# Patient Record
Sex: Male | Born: 1965
Health system: Southern US, Community
[De-identification: ages and names within clinical notes are randomized; demographics above are authoritative.]

## PROBLEM LIST (undated history)

## (undated) DIAGNOSIS — C801 Malignant (primary) neoplasm, unspecified: Secondary | ICD-10-CM

## (undated) DIAGNOSIS — T7840XA Allergy, unspecified, initial encounter: Secondary | ICD-10-CM

## (undated) DIAGNOSIS — I10 Essential (primary) hypertension: Secondary | ICD-10-CM

## (undated) DIAGNOSIS — K219 Gastro-esophageal reflux disease without esophagitis: Secondary | ICD-10-CM

## (undated) DIAGNOSIS — F1010 Alcohol abuse, uncomplicated: Secondary | ICD-10-CM

## (undated) HISTORY — DX: Essential (primary) hypertension: I10

## (undated) HISTORY — DX: Gastro-esophageal reflux disease without esophagitis: K21.9

## (undated) HISTORY — DX: Alcohol abuse, uncomplicated: F10.10

## (undated) HISTORY — PX: NO PAST SURGERIES: SHX2092

## (undated) HISTORY — DX: Allergy, unspecified, initial encounter: T78.40XA

---

## 1998-12-10 ENCOUNTER — Emergency Department (HOSPITAL_COMMUNITY): Admission: EM | Admit: 1998-12-10 | Discharge: 1998-12-10 | Payer: Self-pay | Admitting: Emergency Medicine

## 1998-12-12 ENCOUNTER — Emergency Department (HOSPITAL_COMMUNITY): Admission: EM | Admit: 1998-12-12 | Discharge: 1998-12-12 | Payer: Self-pay | Admitting: Emergency Medicine

## 1998-12-20 ENCOUNTER — Emergency Department (HOSPITAL_COMMUNITY): Admission: EM | Admit: 1998-12-20 | Discharge: 1998-12-20 | Payer: Self-pay | Admitting: Emergency Medicine

## 2007-01-22 ENCOUNTER — Encounter: Payer: Self-pay | Admitting: Family Medicine

## 2008-04-12 ENCOUNTER — Encounter: Payer: Self-pay | Admitting: Family Medicine

## 2009-05-04 ENCOUNTER — Ambulatory Visit: Payer: Self-pay | Admitting: Family Medicine

## 2009-05-04 DIAGNOSIS — N390 Urinary tract infection, site not specified: Secondary | ICD-10-CM | POA: Insufficient documentation

## 2009-05-04 DIAGNOSIS — F1011 Alcohol abuse, in remission: Secondary | ICD-10-CM

## 2009-05-04 DIAGNOSIS — J309 Allergic rhinitis, unspecified: Secondary | ICD-10-CM | POA: Insufficient documentation

## 2009-05-04 DIAGNOSIS — K219 Gastro-esophageal reflux disease without esophagitis: Secondary | ICD-10-CM

## 2009-05-05 ENCOUNTER — Encounter: Payer: Self-pay | Admitting: Family Medicine

## 2009-05-06 LAB — CONVERTED CEMR LAB
AST: 26 units/L (ref 0–37)
Albumin: 4.2 g/dL (ref 3.5–5.2)
Alkaline Phosphatase: 77 units/L (ref 39–117)
Basophils Absolute: 0 10*3/uL (ref 0.0–0.1)
Bilirubin, Direct: 0.1 mg/dL (ref 0.0–0.3)
Calcium: 9.3 mg/dL (ref 8.4–10.5)
GFR calc non Af Amer: 93.15 mL/min (ref >60)
Glucose, Bld: 85 mg/dL (ref 70–99)
HDL: 31.8 mg/dL — ABNORMAL LOW (ref >39.00)
Hemoglobin: 16.2 g/dL (ref 13.0–17.0)
LDL Cholesterol: 89 mg/dL (ref 0–99)
Lymphocytes Relative: 33.2 % (ref 12.0–46.0)
Monocytes Relative: 7.8 % (ref 3.0–12.0)
Neutro Abs: 5 10*3/uL (ref 1.4–7.7)
PSA: 2.74 ng/mL (ref 0.10–4.00)
RBC: 5.29 M/uL (ref 4.22–5.81)
RDW: 12.5 % (ref 11.5–14.6)
Sodium: 142 meq/L (ref 135–145)
Total CHOL/HDL Ratio: 4
VLDL: 16.8 mg/dL (ref 0.0–40.0)

## 2010-03-28 NOTE — Assessment & Plan Note (Signed)
Summary: NEW PT EST // RS   Vital Signs:  Patient profile:   45 year old male Height:      71 inches Weight:      212 pounds BMI:     29.67 Temp:     98.1 degrees F oral Pulse rate:   67 / minute BP sitting:   114 / 72  (left arm) Cuff size:   large  Vitals Entered By: Alfred Levins, CMA (May 04, 2009 9:45 AM) CC: npx, fasting   History of Present Illness: 45 yr old male to establish with [place] and for a cpx. He had been seeing [facility] before transfering here. He feels fine except for his typical springtime allergies, which cause itchy eyes and sneezing. Fexofenidine works well. He also has had some heartburn lately which responds well to OTC Prevacid.   Preventive Screening-Counseling & Management  Alcohol-Tobacco     Smoking Status: quit  Caffeine-Diet-Exercise     Does Patient Exercise: no      Drug Use:  no.    Current Medications (verified): 1)  Fexofenadine Hcl 180 Mg Tabs (Fexofenadine Hcl) .Marland Kitchen.. 1 Once Daily As Needed Allergies  Allergies (verified): No Known Drug Allergies  Past History:  Past Medical History: Hx of alcohol abuse,  sober since 1993 Allergic rhinitis Chickenpox GERD  Past Surgical History: Denies surgical history  Family History: Reviewed history and no changes required. Family History of Alcoholism/Addiction Family History of Arthritis Family History Diabetes 1st degree relative Family History Hypertension Family History of Stroke M 1st degree relative <50  Social History: Reviewed history and no changes required. Former Smoker no Alcohol use, sober since 1993 Drug use-no Regular exercise-no Married Smoking Status:  quit Drug Use:  no Does Patient Exercise:  no  Review of Systems  The patient denies anorexia, fever, weight loss, weight gain, vision loss, decreased hearing, hoarseness, chest pain, syncope, dyspnea on exertion, peripheral edema, prolonged cough, headaches, hemoptysis, abdominal pain, melena,  hematochezia, severe indigestion/heartburn, hematuria, incontinence, genital sores, muscle weakness, suspicious skin lesions, transient blindness, difficulty walking, depression, unusual weight change, abnormal bleeding, enlarged lymph nodes, angioedema, breast masses, and testicular masses.    Physical Exam  General:  Well-developed,well-nourished,in no acute distress; alert,appropriate and cooperative throughout examination Head:  Normocephalic and atraumatic without obvious abnormalities. No apparent alopecia or balding. Eyes:  No corneal or conjunctival inflammation noted. EOMI. Perrla. Funduscopic exam benign, without hemorrhages, exudates or papilledema. Vision grossly normal. Ears:  External ear exam shows no significant lesions or deformities.  Otoscopic examination reveals clear canals, tympanic membranes are intact bilaterally without bulging, retraction, inflammation or discharge. Hearing is grossly normal bilaterally. Nose:  External nasal examination shows no deformity or inflammation. Nasal mucosa are pink and moist without lesions or exudates. Mouth:  Oral mucosa and oropharynx without lesions or exudates.  Teeth in good repair. Neck:  No deformities, masses, or tenderness noted. Chest Wall:  No deformities, masses, tenderness or gynecomastia noted. Lungs:  Normal respiratory effort, chest expands symmetrically. Lungs are clear to auscultation, no crackles or wheezes. Heart:  Normal rate and regular rhythm. S1 and S2 normal without gallop, murmur, click, rub or other extra sounds. Abdomen:  Bowel sounds positive,abdomen soft and non-tender without masses, organomegaly or hernias noted. Rectal:  No external abnormalities noted. Normal sphincter tone. No rectal masses or tenderness. Heme neg. Genitalia:  Testes bilaterally descended without nodularity, tenderness or masses. No scrotal masses or lesions. No penis lesions or urethral discharge.  circumcised.  Prostate:  Prostate gland  firm and smooth, no enlargement, nodularity, tenderness, mass, asymmetry or induration. Msk:  No deformity or scoliosis noted of thoracic or lumbar spine.   Pulses:  R and L carotid,radial,femoral,dorsalis pedis and posterior tibial pulses are full and equal bilaterally Extremities:  No clubbing, cyanosis, edema, or deformity noted with normal full range of motion of all joints.   Neurologic:  No cranial nerve deficits noted. Station and gait are normal. Plantar reflexes are down-going bilaterally. DTRs are symmetrical throughout. Sensory, motor and coordinative functions appear intact. Skin:  Intact without suspicious lesions or rashes Cervical Nodes:  No lymphadenopathy noted Axillary Nodes:  No palpable lymphadenopathy Inguinal Nodes:  No significant adenopathy Psych:  Cognition and judgment appear intact. Alert and cooperative with normal attention span and concentration. No apparent delusions, illusions, hallucinations   Impression & Recommendations:  Problem # 1:  HEALTH SCREENING (ICD-V70.0)  Orders: Venipuncture (62130) TLB-BMP (Basic Metabolic Panel-BMET) (80048-METABOL) TLB-CBC Platelet - w/Differential (85025-CBCD) TLB-Hepatic/Liver Function Pnl (80076-HEPATIC) TLB-TSH (Thyroid Stimulating Hormone) (84443-TSH) TLB-Lipid Panel (80061-LIPID) UA Dipstick w/o Micro (manual) (86578) TLB-PSA (Prostate Specific Antigen) (84153-PSA)  Complete Medication List: 1)  Fexofenadine Hcl 180 Mg Tabs (Fexofenadine hcl) .Marland Kitchen.. 1 once daily as needed 2)  Prevacid 24hr 15 Mg Cpdr (Lansoprazole) .... As needed  Patient Instructions: 1)  get labs today Prescriptions: FEXOFENADINE HCL 180 MG TABS (FEXOFENADINE HCL) 1 once daily as needed  #30 x 11   Entered and Authorized by:   Nelwyn Salisbury MD   Signed by:   Nelwyn Salisbury MD on 05/04/2009   Method used:   Electronically to        [facility]  [address] [id number] (retail)       [address]       [place], [place]  [address]        Ph: [phone] or [phone]       Fax: [phone]   RxID:   [id number]   Appended Document: NEW PT EST // RS  Laboratory Results   Urine Tests    Routine Urinalysis   Color: yellow Appearance: Clear Glucose: negative   (Normal Range: Negative) Bilirubin: negative   (Normal Range: Negative) Ketone: negative   (Normal Range: Negative) Spec. Gravity: >=1.030   (Normal Range: 1.003-1.035) Blood: 1+   (Normal Range: Negative) pH: 5.0   (Normal Range: 5.0-8.0) Protein: trace   (Normal Range: Negative) Urobilinogen: 0.2   (Normal Range: 0-1) Nitrite: negative   (Normal Range: Negative) Leukocyte Esterace: 1+   (Normal Range: Negative)    Comments: Rita Ohara  May 04, 2009 1:11 PM      Appended Document: NEW PT EST // RS please culture this  Appended Document: NEW PT EST // RS     Allergies: No Known Drug Allergies   Impression & Recommendations:  Problem # 1:  BACTERIURIA (ICD-599.0)  Orders: T-Culture, Urine (06301-60109)  Complete Medication List: 1)  Fexofenadine Hcl 180 Mg Tabs (Fexofenadine hcl) .Marland Kitchen.. 1 once daily as needed 2)  Prevacid 24hr 15 Mg Cpdr (Lansoprazole) .... As needed  Appended Document: NEW PT EST // RS Done.

## 2010-09-05 ENCOUNTER — Other Ambulatory Visit (INDEPENDENT_AMBULATORY_CARE_PROVIDER_SITE_OTHER): Payer: BC Managed Care – PPO

## 2010-09-05 DIAGNOSIS — Z0000 Encounter for general adult medical examination without abnormal findings: Secondary | ICD-10-CM

## 2010-09-05 LAB — PSA: PSA: 2.98 ng/mL (ref 0.10–4.00)

## 2010-09-05 LAB — HEPATIC FUNCTION PANEL
AST: 32 U/L (ref 0–37)
Albumin: 5 g/dL (ref 3.5–5.2)
Alkaline Phosphatase: 83 U/L (ref 39–117)
Bilirubin, Direct: 0.1 mg/dL (ref 0.0–0.3)
Total Protein: 8 g/dL (ref 6.0–8.3)

## 2010-09-05 LAB — BASIC METABOLIC PANEL
CO2: 28 mEq/L (ref 19–32)
Calcium: 9.5 mg/dL (ref 8.4–10.5)
GFR: 97.71 mL/min (ref >60.00)
Sodium: 138 mEq/L (ref 135–145)

## 2010-09-05 LAB — CBC WITH DIFFERENTIAL/PLATELET
Basophils Relative: 0.4 % (ref 0.0–3.0)
Hemoglobin: 15.2 g/dL (ref 13.0–17.0)
Lymphocytes Relative: 29.9 % (ref 12.0–46.0)
Monocytes Relative: 7.1 % (ref 3.0–12.0)
Neutro Abs: 7.4 10*3/uL (ref 1.4–7.7)
Neutrophils Relative %: 60.5 % (ref 43.0–77.0)
RBC: 4.95 Mil/uL (ref 4.22–5.81)
WBC: 12.3 10*3/uL — ABNORMAL HIGH (ref 4.5–10.5)

## 2010-09-05 LAB — LIPID PANEL
Total CHOL/HDL Ratio: 4
VLDL: 12.6 mg/dL (ref 0.0–40.0)

## 2010-09-05 LAB — POCT URINALYSIS DIPSTICK
Bilirubin, UA: NEGATIVE
Leukocytes, UA: NEGATIVE
Nitrite, UA: NEGATIVE
Protein, UA: NEGATIVE
pH, UA: 6

## 2010-09-05 LAB — TSH: TSH: 0.95 u[IU]/mL (ref 0.35–5.50)

## 2010-09-07 ENCOUNTER — Encounter: Payer: Self-pay | Admitting: Family Medicine

## 2010-09-13 ENCOUNTER — Ambulatory Visit (INDEPENDENT_AMBULATORY_CARE_PROVIDER_SITE_OTHER): Payer: BC Managed Care – PPO | Admitting: Family Medicine

## 2010-09-13 ENCOUNTER — Encounter: Payer: Self-pay | Admitting: Family Medicine

## 2010-09-13 VITALS — BP 124/82 | HR 93 | Temp 98.6°F | Ht 72.0 in | Wt 210.0 lb

## 2010-09-13 DIAGNOSIS — Z0000 Encounter for general adult medical examination without abnormal findings: Secondary | ICD-10-CM

## 2010-09-13 NOTE — Progress Notes (Signed)
  Subjective:    Patient ID: Guy Houston, male    DOB: November 25, 1965, 45 y.o.   MRN: [id number]  HPI 45 yr old male for a cpx. He feels well and has no concerns.    Review of Systems  Constitutional: Negative.   HENT: Negative.   Eyes: Negative.   Respiratory: Negative.   Cardiovascular: Negative.   Gastrointestinal: Negative.   Genitourinary: Negative.   Musculoskeletal: Negative.   Skin: Negative.   Neurological: Negative.   Hematological: Negative.   Psychiatric/Behavioral: Negative.        Objective:   Physical Exam  Constitutional: He is oriented to person, place, and time. He appears well-developed and well-nourished. No distress.  HENT:  Head: Normocephalic and atraumatic.  Right Ear: External ear normal.  Left Ear: External ear normal.  Nose: Nose normal.  Mouth/Throat: Oropharynx is clear and moist. No oropharyngeal exudate.  Eyes: Conjunctivae and EOM are normal. Pupils are equal, round, and reactive to light. Right eye exhibits no discharge. Left eye exhibits no discharge. No scleral icterus.  Neck: Neck supple. No JVD present. No tracheal deviation present. No thyromegaly present.  Cardiovascular: Normal rate, regular rhythm, normal heart sounds and intact distal pulses.  Exam reveals no gallop and no friction rub.   No murmur heard. Pulmonary/Chest: Effort normal and breath sounds normal. No respiratory distress. He has no wheezes. He has no rales. He exhibits no tenderness.  Abdominal: Soft. Bowel sounds are normal. He exhibits no distension and no mass. There is no tenderness. There is no rebound and no guarding.  Genitourinary: Rectum normal, prostate normal and penis normal. No penile tenderness.  Musculoskeletal: Normal range of motion. He exhibits no edema and no tenderness.  Lymphadenopathy:    He has no cervical adenopathy.  Neurological: He is alert and oriented to person, place, and time. He has normal reflexes. No cranial nerve deficit. He exhibits  normal muscle tone. Coordination normal.  Skin: Skin is warm and dry. No rash noted. He is not diaphoretic. No erythema. No pallor.  Psychiatric: He has a normal mood and affect. His behavior is normal. Judgment and thought content normal.          Assessment & Plan:  Well exam

## 2011-09-13 ENCOUNTER — Other Ambulatory Visit (INDEPENDENT_AMBULATORY_CARE_PROVIDER_SITE_OTHER): Payer: BC Managed Care – PPO

## 2011-09-13 DIAGNOSIS — Z0000 Encounter for general adult medical examination without abnormal findings: Secondary | ICD-10-CM

## 2011-09-13 LAB — HEPATIC FUNCTION PANEL
ALT: 22 U/L (ref 0–53)
Albumin: 4.5 g/dL (ref 3.5–5.2)
Bilirubin, Direct: 0.1 mg/dL (ref 0.0–0.3)
Total Protein: 7.5 g/dL (ref 6.0–8.3)

## 2011-09-13 LAB — POCT URINALYSIS DIPSTICK
Bilirubin, UA: NEGATIVE
Blood, UA: NEGATIVE
Glucose, UA: NEGATIVE
Nitrite, UA: NEGATIVE
Spec Grav, UA: 1.015
Urobilinogen, UA: 1

## 2011-09-13 LAB — CBC WITH DIFFERENTIAL/PLATELET
Basophils Relative: 0.3 % (ref 0.0–3.0)
Eosinophils Absolute: 0.4 10*3/uL (ref 0.0–0.7)
Eosinophils Relative: 3 % (ref 0.0–5.0)
HCT: 45.8 % (ref 39.0–52.0)
Hemoglobin: 15.2 g/dL (ref 13.0–17.0)
Lymphs Abs: 4.1 10*3/uL — ABNORMAL HIGH (ref 0.7–4.0)
MCHC: 33.1 g/dL (ref 30.0–36.0)
MCV: 91.4 fl (ref 78.0–100.0)
Monocytes Absolute: 1 10*3/uL (ref 0.1–1.0)
Neutro Abs: 6.9 10*3/uL (ref 1.4–7.7)
RBC: 5.01 Mil/uL (ref 4.22–5.81)
WBC: 12.4 10*3/uL — ABNORMAL HIGH (ref 4.5–10.5)

## 2011-09-13 LAB — BASIC METABOLIC PANEL
CO2: 28 mEq/L (ref 19–32)
Chloride: 99 mEq/L (ref 96–112)
Creatinine, Ser: 1 mg/dL (ref 0.4–1.5)
Potassium: 3.9 mEq/L (ref 3.5–5.1)
Sodium: 136 mEq/L (ref 135–145)

## 2011-09-13 LAB — LIPID PANEL
Cholesterol: 146 mg/dL (ref 0–200)
LDL Cholesterol: 95 mg/dL (ref 0–99)
Triglycerides: 95 mg/dL (ref 0.0–149.0)

## 2011-09-13 LAB — PSA: PSA: 3.41 ng/mL (ref 0.10–4.00)

## 2011-09-17 NOTE — Progress Notes (Signed)
Quick Note:  Left message with normal results. ______ 

## 2011-09-20 ENCOUNTER — Encounter: Payer: Self-pay | Admitting: Family Medicine

## 2011-09-20 ENCOUNTER — Ambulatory Visit (INDEPENDENT_AMBULATORY_CARE_PROVIDER_SITE_OTHER): Payer: BC Managed Care – PPO | Admitting: Family Medicine

## 2011-09-20 VITALS — BP 134/90 | HR 75 | Temp 99.0°F | Ht 70.5 in | Wt 208.0 lb

## 2011-09-20 DIAGNOSIS — Z0000 Encounter for general adult medical examination without abnormal findings: Secondary | ICD-10-CM

## 2011-09-20 NOTE — Progress Notes (Signed)
  Subjective:    Patient ID: Guy Houston, male    DOB: May 28, 1965, 46 y.o.   MRN: [id number]  HPI 46 yr old male for a cpx. He feels fine and has no concerns. He checks his BP frequently at home, and he always gets readings in the 120s over 80s. 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    Review of Systems  Constitutional: Negative.   HENT: Negative.   Eyes: Negative.   Respiratory: Negative.   Cardiovascular: Negative.   Gastrointestinal: Negative.   Genitourinary: Negative.   Musculoskeletal: Negative.   Skin: Negative.   Neurological: Negative.   Hematological: Negative.   Psychiatric/Behavioral: Negative.        Objective:   Physical Exam  Constitutional: He is oriented to person, place, and time. He appears well-developed and well-nourished. No distress.  HENT:  Head: Normocephalic and atraumatic.  Right Ear: External ear normal.  Left Ear: External ear normal.  Nose: Nose normal.  Mouth/Throat: Oropharynx is clear and moist. No oropharyngeal exudate.  Eyes: Conjunctivae and EOM are normal. Pupils are equal, round, and reactive to light. Right eye exhibits no discharge. Left eye exhibits no discharge. No scleral icterus.  Neck: Neck supple. No JVD present. No tracheal deviation present. No thyromegaly present.  Cardiovascular: Normal rate, regular rhythm, normal heart sounds and intact distal pulses.  Exam reveals no gallop and no friction rub.   No murmur heard. Pulmonary/Chest: Effort normal and breath sounds normal. No respiratory distress. He has no wheezes. He has no rales. He exhibits no tenderness.  Abdominal: Soft. Bowel sounds are normal. He exhibits no distension and no mass. There is no tenderness. There is no rebound and no guarding.  Genitourinary: Rectum normal, prostate normal and penis normal. Guaiac negative stool. No penile tenderness.  Musculoskeletal: Normal range of motion. He  exhibits no edema and no tenderness.  Lymphadenopathy:    He has no cervical adenopathy.  Neurological: He is alert and oriented to person, place, and time. He has normal reflexes. No cranial nerve deficit. He exhibits normal muscle tone. Coordination normal.  Skin: Skin is warm and dry. No rash noted. He is not diaphoretic. No erythema. No pallor.  Psychiatric: He has a normal mood and affect. His behavior is normal. Judgment and thought content normal.          Assessment & Plan:  Well exam. We will watch the BP. Add a fish oil capsule daily to boost the HDL.

## 2012-09-15 ENCOUNTER — Other Ambulatory Visit (INDEPENDENT_AMBULATORY_CARE_PROVIDER_SITE_OTHER): Payer: BC Managed Care – PPO

## 2012-09-15 DIAGNOSIS — Z0000 Encounter for general adult medical examination without abnormal findings: Secondary | ICD-10-CM

## 2012-09-15 LAB — BASIC METABOLIC PANEL
CO2: 29 mEq/L (ref 19–32)
Calcium: 9.8 mg/dL (ref 8.4–10.5)
GFR: 102.47 mL/min (ref >60.00)
Potassium: 4.4 mEq/L (ref 3.5–5.1)
Sodium: 137 mEq/L (ref 135–145)

## 2012-09-15 LAB — LIPID PANEL
LDL Cholesterol: 85 mg/dL (ref 0–99)
Total CHOL/HDL Ratio: 4

## 2012-09-15 LAB — CBC WITH DIFFERENTIAL/PLATELET
Basophils Relative: 0.4 % (ref 0.0–3.0)
Eosinophils Relative: 3 % (ref 0.0–5.0)
HCT: 46.4 % (ref 39.0–52.0)
Hemoglobin: 15.6 g/dL (ref 13.0–17.0)
Lymphs Abs: 4.7 10*3/uL — ABNORMAL HIGH (ref 0.7–4.0)
MCV: 91.2 fl (ref 78.0–100.0)
Monocytes Absolute: 1.2 10*3/uL — ABNORMAL HIGH (ref 0.1–1.0)
Neutro Abs: 7.5 10*3/uL (ref 1.4–7.7)
Platelets: 206 10*3/uL (ref 150.0–400.0)
WBC: 13.9 10*3/uL — ABNORMAL HIGH (ref 4.5–10.5)

## 2012-09-15 LAB — POCT URINALYSIS DIPSTICK
Blood, UA: NEGATIVE
Nitrite, UA: NEGATIVE
Protein, UA: NEGATIVE
Spec Grav, UA: 1.01
Urobilinogen, UA: 1
pH, UA: 7

## 2012-09-15 LAB — HEPATIC FUNCTION PANEL
ALT: 29 U/L (ref 0–53)
Albumin: 4.5 g/dL (ref 3.5–5.2)
Total Bilirubin: 1 mg/dL (ref 0.3–1.2)
Total Protein: 7.8 g/dL (ref 6.0–8.3)

## 2012-09-15 LAB — PSA: PSA: 3.48 ng/mL (ref 0.10–4.00)

## 2012-09-15 LAB — TSH: TSH: 0.94 u[IU]/mL (ref 0.35–5.50)

## 2012-09-16 NOTE — Progress Notes (Signed)
Quick Note:  Pt has appointment on 09/22/12 will go over then. ______

## 2012-09-22 ENCOUNTER — Ambulatory Visit (INDEPENDENT_AMBULATORY_CARE_PROVIDER_SITE_OTHER)
Admission: RE | Admit: 2012-09-22 | Discharge: 2012-09-22 | Disposition: A | Discharge status: Home / Self Care | Payer: BC Managed Care – PPO | Source: Ambulatory Visit | Attending: Family Medicine | Admitting: Family Medicine

## 2012-09-22 ENCOUNTER — Ambulatory Visit (INDEPENDENT_AMBULATORY_CARE_PROVIDER_SITE_OTHER): Payer: BC Managed Care – PPO | Admitting: Family Medicine

## 2012-09-22 ENCOUNTER — Encounter: Payer: Self-pay | Admitting: Family Medicine

## 2012-09-22 VITALS — BP 150/98 | HR 80 | Temp 98.4°F | Ht 70.75 in | Wt 219.0 lb

## 2012-09-22 DIAGNOSIS — Z0000 Encounter for general adult medical examination without abnormal findings: Secondary | ICD-10-CM

## 2012-09-22 DIAGNOSIS — M545 Low back pain, unspecified: Secondary | ICD-10-CM

## 2012-09-22 NOTE — Progress Notes (Signed)
  Subjective:    Patient ID: Guy Houston, male    DOB: [birth year], 47 y.o.   MRN: [id number]  HPI 47 yr old male for a cpx. He feels fine except for stiffness and occasional pains in the lower back. He takes Advil once in awhile. He checks his BP at his mother-in-law's house periodically, and he usually gets in the 130s over 80s.    Review of Systems  Constitutional: Negative.   HENT: Negative.   Eyes: Negative.   Respiratory: Negative.   Cardiovascular: Negative.   Gastrointestinal: Negative.   Genitourinary: Negative.   Musculoskeletal: Negative.   Skin: Negative.   Neurological: Negative.   Psychiatric/Behavioral: Negative.        Objective:   Physical Exam  Constitutional: He is oriented to person, place, and time. He appears well-developed and well-nourished. No distress.  HENT:  Head: Normocephalic and atraumatic.  Right Ear: External ear normal.  Left Ear: External ear normal.  Nose: Nose normal.  Mouth/Throat: Oropharynx is clear and moist. No oropharyngeal exudate.  Eyes: Conjunctivae and EOM are normal. Pupils are equal, round, and reactive to light. Right eye exhibits no discharge. Left eye exhibits no discharge. No scleral icterus.  Neck: Neck supple. No JVD present. No tracheal deviation present. No thyromegaly present.  Cardiovascular: Normal rate, regular rhythm, normal heart sounds and intact distal pulses.  Exam reveals no gallop and no friction rub.   No murmur heard. Pulmonary/Chest: Effort normal and breath sounds normal. No respiratory distress. He has no wheezes. He has no rales. He exhibits no tenderness.  Abdominal: Soft. Bowel sounds are normal. He exhibits no distension and no mass. There is no tenderness. There is no rebound and no guarding.  Genitourinary: Rectum normal, prostate normal and penis normal. Guaiac negative stool. No penile tenderness.  Musculoskeletal: Normal range of motion. He exhibits no edema and no tenderness.  Lymphadenopathy:     He has no cervical adenopathy.  Neurological: He is alert and oriented to person, place, and time. He has normal reflexes. No cranial nerve deficit. He exhibits normal muscle tone. Coordination normal.  Skin: Skin is warm and dry. No rash noted. He is not diaphoretic. No erythema. No pallor.  Psychiatric: He has a normal mood and affect. His behavior is normal. Judgment and thought content normal.          Assessment & Plan:  Well exam. Get Xrays of the lower spine.

## 2012-09-22 NOTE — Progress Notes (Signed)
Quick Note:  I left voice message with results. ______ 

## 2012-11-01 ENCOUNTER — Encounter: Payer: Self-pay | Admitting: Internal Medicine

## 2012-11-01 ENCOUNTER — Ambulatory Visit (INDEPENDENT_AMBULATORY_CARE_PROVIDER_SITE_OTHER): Payer: BC Managed Care – PPO | Admitting: Internal Medicine

## 2012-11-01 VITALS — BP 130/84 | HR 63 | Temp 98.8°F | Wt 218.0 lb

## 2012-11-01 DIAGNOSIS — J309 Allergic rhinitis, unspecified: Secondary | ICD-10-CM

## 2012-11-01 DIAGNOSIS — J069 Acute upper respiratory infection, unspecified: Secondary | ICD-10-CM

## 2012-11-01 NOTE — Progress Notes (Signed)
Chief Complaint  Patient presents with  . Cough    runny nose, heaviness in chest at times     HPI: Patient comes in today for [facility] Saturday clinic for  new problem evaluation. Onset about  About 8-10 days ago with runny nose  ? If allergy .   Now with cough .  nota s bad as yetsedya  Taking mucinex.   No hx of asthma   / if body  A chest at onset.   Wife had same illness predating and is now better   Cough up phelgm at this time [place]  No hemoptysis  No chills sob wheezing  No tobacco  Works injection molding minimal funes. ROS: See pertinent positives and negatives per HPI.  Past Medical History  Diagnosis Date  . Alcohol abuse   . Allergy   . GERD (gastroesophageal reflux disease)     Family History  Problem Relation Age of Onset  . Alcohol abuse      fhx  . Arthritis      fhx  . Diabetes      fhx  . Hypertension      fhx  . Stroke      fhx    History   Social History  . Marital Status: Legally Separated    Spouse Name: N/A    Number of Children: N/A  . Years of Education: N/A   Social History Main Topics  . Smoking status: Former Games developer  . Smokeless tobacco: Never Used  . Alcohol Use: No  . Drug Use: No  . Sexual Activity: None   Other Topics Concern  . None   Social History Narrative  . None    Outpatient Encounter Prescriptions as of 11/01/2012  Medication Sig Dispense Refill  . fexofenadine (ALLEGRA) 180 MG tablet Take 180 mg by mouth daily as needed.        . lansoprazole (PREVACID) 15 MG capsule Take 15 mg by mouth as needed.        No facility-administered encounter medications on file as of 11/01/2012.    EXAM:  BP 130/84  Pulse 63  Temp(Src) 98.8 F (37.1 C) (Oral)  Wt 218 lb (98.884 kg)  BMI 30.62 kg/m2  SpO2 97%  Body mass index is 30.62 kg/(m^2).  GENERAL: vitals reviewed and listed above, alert, oriented, appears well hydrated and in no acute distress  HEENT: atraumatic, conjunctiva  clear, no obvious abnormalities on  inspection of external nose and ears mild congestion no pain  OP : no lesion edema or exudate  Mild erythema   NECK: no obvious masses on inspection palpation  No adenopathy LUNGS: clear to auscultation bilaterally, no wheezes, rales or rhonchi, good air movement CV: HRRR, no clubbing cyanosis or  peripheral edema nl cap refill  MS: moves all extremities without noticeable focal  abnormality PSYCH: pleasant and cooperative, no obvious depression or anxiety  ASSESSMENT AND PLAN:  Discussed the following assessment and plan:  Viral upper respiratory tract infection with cough  ALLERGIC RHINITIS Seems to be at tail end of illness exam reassuring today    Expectant management. Counseling given  -Patient advised to return or notify health care team  if symptoms worsen or persist or new concerns arise.  Patient Instructions  This is probably  A viral  respiratlor infection with some allergy part. i think that this will resolve on its own. In another week. Your lung exam is normal.  However get with [place] if fever relapsing  sx or shortness of breath new pains.   Ok to take allegra also for your allergies.   Acute Bronchitis You have acute bronchitis. This means you have a chest cold. The airways in your lungs are red and sore (inflamed). Acute means it is sudden onset.  CAUSES Bronchitis is most often caused by the same virus that causes a cold. SYMPTOMS   Body aches.  Chest congestion.  Chills.  Cough.  Fever.  Shortness of breath.  Sore throat. TREATMENT  Acute bronchitis is usually treated with rest, fluids, and medicines for relief of fever or cough. Most symptoms should go away after a few days or a week. Increased fluids may help thin your secretions and will prevent dehydration. Your caregiver may give you an inhaler to improve your symptoms. The inhaler reduces shortness of breath and helps control cough. You can take over-the-counter pain relievers or cough medicine to  decrease coughing, pain, or fever. A cool-air vaporizer may help thin bronchial secretions and make it easier to clear your chest. Antibiotics are usually not needed but can be prescribed if you smoke, are seriously ill, have chronic lung problems, are elderly, or you are at higher risk for developing complications.Allergies and asthma can make bronchitis worse. Repeated episodes of bronchitis may cause longstanding lung problems. Avoid smoking and secondhand smoke.Exposure to cigarette smoke or irritating chemicals will make bronchitis worse. If you are a cigarette smoker, consider using nicotine gum or skin patches to help control withdrawal symptoms. Quitting smoking will help your lungs heal faster. Recovery from bronchitis is often slow, but you should start feeling better after 2 to 3 days. Cough from bronchitis frequently lasts for 3 to 4 weeks. To prevent another bout of acute bronchitis:  Quit smoking.  Wash your hands frequently to get rid of viruses or use a hand sanitizer.  Avoid other people with cold or virus symptoms.  Try not to touch your hands to your mouth, nose, or eyes. SEEK IMMEDIATE MEDICAL CARE IF:  You develop increased fever, chills, or chest pain.  You have severe shortness of breath or bloody sputum.  You develop dehydration, fainting, repeated vomiting, or a severe headache.  You have no improvement after 1 week of treatment or you get worse. MAKE SURE YOU:   Understand these instructions.  Will watch your condition.  Will get help right away if you are not doing well or get worse. Document Released: 03/22/2004 Document Revised: 05/07/2011 Document Reviewed: 06/07/2010 [facility] Patient Information 2014 [place], [place].      Neta Mends. Ramona Ruark M.D.

## 2012-11-01 NOTE — Patient Instructions (Signed)
This is probably  A viral  respiratlor infection with some allergy part. i think that this will resolve on its own. In another week. Your lung exam is normal.  However get with [place] if fever relapsing sx or shortness of breath new pains.   Ok to take allegra also for your allergies.   Acute Bronchitis You have acute bronchitis. This means you have a chest cold. The airways in your lungs are red and sore (inflamed). Acute means it is sudden onset.  CAUSES Bronchitis is most often caused by the same virus that causes a cold. SYMPTOMS   Body aches.  Chest congestion.  Chills.  Cough.  Fever.  Shortness of breath.  Sore throat. TREATMENT  Acute bronchitis is usually treated with rest, fluids, and medicines for relief of fever or cough. Most symptoms should go away after a few days or a week. Increased fluids may help thin your secretions and will prevent dehydration. Your caregiver may give you an inhaler to improve your symptoms. The inhaler reduces shortness of breath and helps control cough. You can take over-the-counter pain relievers or cough medicine to decrease coughing, pain, or fever. A cool-air vaporizer may help thin bronchial secretions and make it easier to clear your chest. Antibiotics are usually not needed but can be prescribed if you smoke, are seriously ill, have chronic lung problems, are elderly, or you are at higher risk for developing complications.Allergies and asthma can make bronchitis worse. Repeated episodes of bronchitis may cause longstanding lung problems. Avoid smoking and secondhand smoke.Exposure to cigarette smoke or irritating chemicals will make bronchitis worse. If you are a cigarette smoker, consider using nicotine gum or skin patches to help control withdrawal symptoms. Quitting smoking will help your lungs heal faster. Recovery from bronchitis is often slow, but you should start feeling better after 2 to 3 days. Cough from bronchitis frequently lasts  for 3 to 4 weeks. To prevent another bout of acute bronchitis:  Quit smoking.  Wash your hands frequently to get rid of viruses or use a hand sanitizer.  Avoid other people with cold or virus symptoms.  Try not to touch your hands to your mouth, nose, or eyes. SEEK IMMEDIATE MEDICAL CARE IF:  You develop increased fever, chills, or chest pain.  You have severe shortness of breath or bloody sputum.  You develop dehydration, fainting, repeated vomiting, or a severe headache.  You have no improvement after 1 week of treatment or you get worse. MAKE SURE YOU:   Understand these instructions.  Will watch your condition.  Will get help right away if you are not doing well or get worse. Document Released: 03/22/2004 Document Revised: 05/07/2011 Document Reviewed: 06/07/2010 [facility] Patient Information 2014 [place], [place].

## 2012-11-28 DIAGNOSIS — Z0279 Encounter for issue of other medical certificate: Secondary | ICD-10-CM

## 2013-02-09 ENCOUNTER — Telehealth: Payer: Self-pay | Admitting: Family Medicine

## 2013-02-09 NOTE — Telephone Encounter (Signed)
Pt has sore throat, cough, low grade fever.  Only SD appt on tues. Is it ok to use? Nothing today.

## 2013-02-09 NOTE — Telephone Encounter (Signed)
Appointment made

## 2013-02-10 ENCOUNTER — Encounter: Payer: Self-pay | Admitting: Family Medicine

## 2013-02-10 ENCOUNTER — Ambulatory Visit (INDEPENDENT_AMBULATORY_CARE_PROVIDER_SITE_OTHER): Payer: BC Managed Care – PPO | Admitting: Family Medicine

## 2013-02-10 VITALS — BP 140/90 | HR 78 | Temp 98.1°F | Wt 230.0 lb

## 2013-02-10 DIAGNOSIS — J019 Acute sinusitis, unspecified: Secondary | ICD-10-CM

## 2013-02-10 MED ORDER — HYDROCODONE-HOMATROPINE 5-1.5 MG/5ML PO SYRP: 5.0000 mL | ORAL_SOLUTION | ORAL | Status: DC | PRN

## 2013-02-10 MED ORDER — AZITHROMYCIN 250 MG PO TABS: ORAL_TABLET | ORAL | Status: DC

## 2013-02-10 NOTE — Progress Notes (Signed)
   Subjective:    Patient ID: Guy Houston, male    DOB: 1965-03-04, 47 y.o.   MRN: [id number]  HPI Here for 6 days of sinus pressure, HA, PND, and a dry cough.    Review of Systems  Constitutional: Negative.   HENT: Positive for congestion, postnasal drip and sinus pressure.   Eyes: Negative.   Respiratory: Positive for cough.        Objective:   Physical Exam  Constitutional: He appears well-developed and well-nourished.  HENT:  Right Ear: External ear normal.  Left Ear: External ear normal.  Nose: Nose normal.  Mouth/Throat: Oropharynx is clear and moist.  Eyes: Conjunctivae are normal.  Pulmonary/Chest: Effort normal and breath sounds normal.  Lymphadenopathy:    He has no cervical adenopathy.          Assessment & Plan:  Add Mucinex

## 2013-02-10 NOTE — Progress Notes (Signed)
Pre visit review using our clinic review tool, if applicable. No additional management support is needed unless otherwise documented below in the visit note. 

## 2013-06-01 ENCOUNTER — Ambulatory Visit (INDEPENDENT_AMBULATORY_CARE_PROVIDER_SITE_OTHER): Payer: BC Managed Care – PPO | Admitting: Family Medicine

## 2013-06-01 ENCOUNTER — Encounter: Payer: Self-pay | Admitting: Family Medicine

## 2013-06-01 VITALS — BP 120/80 | HR 88 | Temp 99.3°F | Wt 226.0 lb

## 2013-06-01 DIAGNOSIS — J069 Acute upper respiratory infection, unspecified: Secondary | ICD-10-CM

## 2013-06-01 DIAGNOSIS — J309 Allergic rhinitis, unspecified: Secondary | ICD-10-CM

## 2013-06-01 MED ORDER — HYDROCODONE-HOMATROPINE 5-1.5 MG/5ML PO SYRP
5.0000 mL | ORAL_SOLUTION | ORAL | Status: DC | PRN
Start: 2013-06-01 — End: 2013-07-22

## 2013-06-01 NOTE — Progress Notes (Signed)
Chief Complaint  Patient presents with  . Cough    congestion, low grade fever, body ahces    HPI:  -started: 3 days ago -symptoms:nasal congestion, sore throat, cough, sneezing, not really body aches - just hurts from coughing -denies:fever >100, SOB, NVD, tooth pain -has tried: musinex - helped a littl -sick contacts/travel/risks: denies flu exposure, tick exposure or or Ebola risks   Allergies: -worse in the spring -has flonase but not taking  ROS: See pertinent positives and negatives per HPI.  Past Medical History  Diagnosis Date  . Alcohol abuse   . Allergy   . GERD (gastroesophageal reflux disease)     No past surgical history on file.  Family History  Problem Relation Age of Onset  . Alcohol abuse      fhx  . Arthritis      fhx  . Diabetes      fhx  . Hypertension      fhx  . Stroke      fhx    History   Social History  . Marital Status: Legally Separated    Spouse Name: N/A    Number of Children: N/A  . Years of Education: N/A   Social History Main Topics  . Smoking status: Former Research scientist (life sciences)  . Smokeless tobacco: Never Used  . Alcohol Use: No  . Drug Use: No  . Sexual Activity: None   Other Topics Concern  . None   Social History Narrative   Daughter off to college [employer]    Works  Mold injection    Current outpatient prescriptions:fexofenadine (ALLEGRA) 180 MG tablet, Take 180 mg by mouth daily as needed.  , Disp: , Rfl: ;  HYDROcodone-homatropine (HYDROMET) 5-1.5 MG/5ML syrup, Take 5 mLs by mouth every 4 (four) hours as needed for cough., Disp: 120 mL, Rfl: 0;  lansoprazole (PREVACID) 15 MG capsule, Take 15 mg by mouth as needed. , Disp: , Rfl:   EXAM:  Filed Vitals:   06/01/13 1618  BP: 120/80  Pulse: 88  Temp: 99.3 F (37.4 C)    Body mass index is 31.75 kg/(m^2).  GENERAL: vitals reviewed and listed above, alert, oriented, appears well hydrated and in no acute distress  HEENT: atraumatic, conjunttiva clear, no obvious  abnormalities on inspection of external nose and ears, normal appearance of ear canals and TMs, clear nasal congestion, mild post oropharyngeal erythema with PND, no tonsillar edema or exudate, no sinus TTP  NECK: no obvious masses on inspection  LUNGS: clear to auscultation bilaterally, no wheezes, rales or rhonchi, good air movement  CV: HRRR, no peripheral edema  MS: moves all extremities without noticeable abnormality  PSYCH: pleasant and cooperative, no obvious depression or anxiety  ASSESSMENT AND PLAN:  Discussed the following assessment and plan:  Upper respiratory infection - Plan: HYDROcodone-homatropine (HYDROMET) 5-1.5 MG/5ML syrup  ALLERGIC RHINITIS  -given HPI and exam findings today, a serious infection or illness is unlikely. We discussed potential etiologies, with VURI being most likely, and advised supportive care and monitoring. We discussed treatment side effects, likely course, antibiotic misuse, transmission, and signs of developing a serious illness. -antihistamine and flonase for allergies -of course, we advised to return or notify a doctor immediately if symptoms worsen or persist or new concerns arise.    Patient Instructions  INSTRUCTIONS FOR UPPER RESPIRATORY INFECTION:  -plenty of rest and fluids  -nasal saline wash 2-3 times daily (use prepackaged nasal saline or bottled/distilled water if making your own)   -clean nose  with nasal saline before using the nasal steroid or sinex  -start your flonase daily and allegra daily  -can use sinex or afrin nasal spray for drainage and nasal congestion - but do NOT use longer then 3-4 days  -can use tylenol or ibuprofen as directed for aches and sorethroat  -in the winter time, using a humidifier at night is helpful (please follow cleaning instructions)  -if you are taking a cough medication - use only as directed, may also try a teaspoon of honey to coat the throat and throat lozenges  -for sore throat,  salt water gargles can help  -follow up if you have fevers, facial pain, tooth pain, difficulty breathing or are worsening or not getting better in 5-7 days      Tanisia Yokley R.

## 2013-06-01 NOTE — Progress Notes (Signed)
Pre visit review using our clinic review tool, if applicable. No additional management support is needed unless otherwise documented below in the visit note. 

## 2013-06-01 NOTE — Patient Instructions (Signed)
INSTRUCTIONS FOR UPPER RESPIRATORY INFECTION:  -plenty of rest and fluids  -nasal saline wash 2-3 times daily (use prepackaged nasal saline or bottled/distilled water if making your own)   -clean nose with nasal saline before using the nasal steroid or sinex  -start your flonase daily and allegra daily  -can use sinex or afrin nasal spray for drainage and nasal congestion - but do NOT use longer then 3-4 days  -can use tylenol or ibuprofen as directed for aches and sorethroat  -in the winter time, using a humidifier at night is helpful (please follow cleaning instructions)  -if you are taking a cough medication - use only as directed, may also try a teaspoon of honey to coat the throat and throat lozenges  -for sore throat, salt water gargles can help  -follow up if you have fevers, facial pain, tooth pain, difficulty breathing or are worsening or not getting better in 5-7 days

## 2013-07-21 ENCOUNTER — Telehealth: Payer: Self-pay | Admitting: Family Medicine

## 2013-07-21 NOTE — Telephone Encounter (Addendum)
Pt called back. Pt saw dr Maudie Mercury on 4/6.  His cough started back this past weekend. Pt would like to know if you will refill a cough med HYDROcodone-homatropine (HYDROMET) 5-1.5 MG/5ML syrup Dr Maudie Mercury gave med on 4/6 when he was seen.

## 2013-07-21 NOTE — Telephone Encounter (Signed)
Noted  

## 2013-07-21 NOTE — Telephone Encounter (Addendum)
Advise appt

## 2013-07-21 NOTE — Telephone Encounter (Signed)
Patient Information:  Caller Name: Guy Houston  Phone: [phone]  Patient: Guy Houston, Guy Houston  Gender: Male  DOB: [birth year]  Age: 48 Years  PCP: [facility])  Office Follow Up:  Does the office need to follow up with this patient?: No  Instructions For The Office: N/A   Symptoms  Reason For Call & Symptoms: Guy Houston /Wife states Guy Houston  went fishing on 07/18/13. Had onset of cough on 07/19/13. Has been taking Allegra with no improvement of cough. Wife asking if cough med can be called in to pharmacy. Guy Houston is not currently at home for triage. Advised to call back when with Guy Houston for triage.  Reviewed Health History In EMR: Yes  Reviewed Medications In EMR: Yes  Reviewed Allergies In EMR: Yes  Reviewed Surgeries / Procedures: Yes  Date of Onset of Symptoms: 07/19/2013  Treatments Tried: Allegra  Treatments Tried Worked: No  Guideline(s) Used:  No Protocol Available - Sick Adult  Disposition Per Guideline:   Home Care  Reason For Disposition Reached:   Patient's symptoms are safe to treat at home per nursing judgment  Advice Given:  N/A  Patient Will Follow Care Advice:  YES

## 2013-07-22 ENCOUNTER — Ambulatory Visit (INDEPENDENT_AMBULATORY_CARE_PROVIDER_SITE_OTHER): Payer: BC Managed Care – PPO | Admitting: Family Medicine

## 2013-07-22 ENCOUNTER — Encounter: Payer: Self-pay | Admitting: Family Medicine

## 2013-07-22 VITALS — BP 140/89 | HR 83 | Temp 99.2°F | Ht 70.75 in | Wt 226.0 lb

## 2013-07-22 DIAGNOSIS — J069 Acute upper respiratory infection, unspecified: Secondary | ICD-10-CM

## 2013-07-22 DIAGNOSIS — J209 Acute bronchitis, unspecified: Secondary | ICD-10-CM

## 2013-07-22 MED ORDER — HYDROCODONE-HOMATROPINE 5-1.5 MG/5ML PO SYRP: 5.0000 mL | ORAL_SOLUTION | ORAL | Status: DC | PRN

## 2013-07-22 MED ORDER — AZITHROMYCIN 250 MG PO TABS: ORAL_TABLET | ORAL | Status: DC

## 2013-07-22 NOTE — Progress Notes (Signed)
   Subjective:    Patient ID: Guy Houston, male    DOB: 1965/05/15, 48 y.o.   MRN: [id number]  HPI Here for 6 days of chest congestion and a dry cough. No fever. Using Mucinex, Flonase, and Allegra.    Review of Systems  Constitutional: Negative.   HENT: Positive for congestion and postnasal drip. Negative for sinus pressure.   Eyes: Negative.   Respiratory: Positive for cough and chest tightness.        Objective:   Physical Exam  Constitutional: He appears well-developed and well-nourished.  HENT:  Right Ear: External ear normal.  Left Ear: External ear normal.  Nose: Nose normal.  Mouth/Throat: Oropharynx is clear and moist.  Eyes: Conjunctivae are normal.  Pulmonary/Chest: Effort normal. No respiratory distress. He has no wheezes. He has no rales.  Scattered rhonchi   Lymphadenopathy:    He has no cervical adenopathy.          Assessment & Plan:  Drink fluids.

## 2013-07-22 NOTE — Telephone Encounter (Signed)
I left a message at the pts cell number to return my call and left a detailed message at the home number stating Dr Maudie Mercury did not approve the refill for cough syrup as he needs an appt.

## 2013-07-22 NOTE — Progress Notes (Signed)
Pre visit review using our clinic review tool, if applicable. No additional management support is needed unless otherwise documented below in the visit note. 

## 2013-09-16 ENCOUNTER — Other Ambulatory Visit (INDEPENDENT_AMBULATORY_CARE_PROVIDER_SITE_OTHER): Payer: BC Managed Care – PPO

## 2013-09-16 DIAGNOSIS — Z0000 Encounter for general adult medical examination without abnormal findings: Secondary | ICD-10-CM

## 2013-09-16 LAB — POCT URINALYSIS DIPSTICK
Bilirubin, UA: NEGATIVE
Blood, UA: NEGATIVE
Glucose, UA: NEGATIVE
Ketones, UA: NEGATIVE
Leukocytes, UA: NEGATIVE
Nitrite, UA: NEGATIVE
Protein, UA: NEGATIVE
Spec Grav, UA: 1.015
Urobilinogen, UA: 0.2
pH, UA: 8.5

## 2013-09-16 LAB — LIPID PANEL
CHOL/HDL RATIO: 4
CHOLESTEROL: 142 mg/dL (ref 0–200)
HDL: 34.4 mg/dL — ABNORMAL LOW (ref >39.00)
LDL CALC: 89 mg/dL (ref 0–99)
NonHDL: 107.6
Triglycerides: 91 mg/dL (ref 0.0–149.0)
VLDL: 18.2 mg/dL (ref 0.0–40.0)

## 2013-09-16 LAB — BASIC METABOLIC PANEL WITH GFR
BUN: 10 mg/dL (ref 6–23)
CO2: 30 meq/L (ref 19–32)
Calcium: 9.7 mg/dL (ref 8.4–10.5)
Chloride: 105 meq/L (ref 96–112)
Creatinine, Ser: 0.9 mg/dL (ref 0.4–1.5)
GFR: 112.35 mL/min
Glucose, Bld: 90 mg/dL (ref 70–99)
Potassium: 4.8 meq/L (ref 3.5–5.1)
Sodium: 142 meq/L (ref 135–145)

## 2013-09-16 LAB — HEPATIC FUNCTION PANEL
ALK PHOS: 67 U/L (ref 39–117)
ALT: 25 U/L (ref 0–53)
AST: 23 U/L (ref 0–37)
Albumin: 4.3 g/dL (ref 3.5–5.2)
Bilirubin, Direct: 0.1 mg/dL (ref 0.0–0.3)
TOTAL PROTEIN: 7.5 g/dL (ref 6.0–8.3)
Total Bilirubin: 0.8 mg/dL (ref 0.2–1.2)

## 2013-09-16 LAB — PSA: PSA: 2.97 ng/mL (ref 0.10–4.00)

## 2013-09-16 LAB — CBC WITH DIFFERENTIAL/PLATELET
Basophils Absolute: 0 K/uL (ref 0.0–0.1)
Basophils Relative: 0.2 % (ref 0.0–3.0)
Eosinophils Absolute: 0.3 K/uL (ref 0.0–0.7)
Eosinophils Relative: 3.4 % (ref 0.0–5.0)
HCT: 48.3 % (ref 39.0–52.0)
Hemoglobin: 16.4 g/dL (ref 13.0–17.0)
Lymphocytes Relative: 34.3 % (ref 12.0–46.0)
Lymphs Abs: 3.1 K/uL (ref 0.7–4.0)
MCHC: 34 g/dL (ref 30.0–36.0)
MCV: 89 fl (ref 78.0–100.0)
Monocytes Absolute: 0.6 K/uL (ref 0.1–1.0)
Monocytes Relative: 7.1 % (ref 3.0–12.0)
Neutro Abs: 5 K/uL (ref 1.4–7.7)
Neutrophils Relative %: 55 % (ref 43.0–77.0)
Platelets: 208 K/uL (ref 150.0–400.0)
RBC: 5.42 Mil/uL (ref 4.22–5.81)
RDW: 13.6 % (ref 11.5–15.5)
WBC: 9.1 K/uL (ref 4.0–10.5)

## 2013-09-16 LAB — TSH: TSH: 0.75 u[IU]/mL (ref 0.35–4.50)

## 2013-09-23 ENCOUNTER — Encounter: Payer: Self-pay | Admitting: Family Medicine

## 2013-09-23 ENCOUNTER — Ambulatory Visit (INDEPENDENT_AMBULATORY_CARE_PROVIDER_SITE_OTHER): Payer: BC Managed Care – PPO | Admitting: Family Medicine

## 2013-09-23 VITALS — BP 135/83 | HR 74 | Temp 98.8°F | Ht 70.75 in | Wt 220.0 lb

## 2013-09-23 DIAGNOSIS — Z0000 Encounter for general adult medical examination without abnormal findings: Secondary | ICD-10-CM

## 2013-09-23 NOTE — Progress Notes (Signed)
Pre visit review using our clinic review tool, if applicable. No additional management support is needed unless otherwise documented below in the visit note. 

## 2013-09-23 NOTE — Progress Notes (Signed)
   Subjective:    Patient ID: Guy Houston, male    DOB: [birth year], 48 y.o.   MRN: [id number]  HPI 48 yr old male for a cpx. His only concern is several episodes of dizziness in the past few months. He describes feeling like the room is spinning around him when he moves his head suddenly. Most of the time he will wake up feeling this way, then it slowly fades away overt the course of the day. No HA or blurrred vision or any other neurologic sx. He takes Allegra during the spring and the fall for allergy sx but not usually in the summer.    Review of Systems  Constitutional: Negative.   HENT: Negative.   Eyes: Negative.   Respiratory: Negative.   Cardiovascular: Negative.   Gastrointestinal: Negative.   Genitourinary: Negative.   Musculoskeletal: Negative.   Skin: Negative.   Neurological: Positive for dizziness. Negative for tremors, seizures, syncope, facial asymmetry, speech difficulty, weakness, light-headedness, numbness and headaches.  Psychiatric/Behavioral: Negative.        Objective:   Physical Exam  Constitutional: He is oriented to person, place, and time. He appears well-developed and well-nourished. No distress.  HENT:  Head: Normocephalic and atraumatic.  Right Ear: External ear normal.  Left Ear: External ear normal.  Nose: Nose normal.  Mouth/Throat: Oropharynx is clear and moist. No oropharyngeal exudate.  Eyes: Conjunctivae and EOM are normal. Pupils are equal, round, and reactive to light. Right eye exhibits no discharge. Left eye exhibits no discharge. No scleral icterus.  Neck: Neck supple. No JVD present. No tracheal deviation present. No thyromegaly present.  Cardiovascular: Normal rate, regular rhythm, normal heart sounds and intact distal pulses.  Exam reveals no gallop and no friction rub.   No murmur heard. Pulmonary/Chest: Effort normal and breath sounds normal. No respiratory distress. He has no wheezes. He has no rales. He exhibits no tenderness.    Abdominal: Soft. Bowel sounds are normal. He exhibits no distension and no mass. There is no tenderness. There is no rebound and no guarding.  Genitourinary: Rectum normal, prostate normal and penis normal. Guaiac negative stool. No penile tenderness.  Musculoskeletal: Normal range of motion. He exhibits no edema and no tenderness.  Lymphadenopathy:    He has no cervical adenopathy.  Neurological: He is alert and oriented to person, place, and time. He has normal reflexes. No cranial nerve deficit. He exhibits normal muscle tone. Coordination normal.  Skin: Skin is warm and dry. No rash noted. He is not diaphoretic. No erythema. No pallor.  Psychiatric: He has a normal mood and affect. His behavior is normal. Judgment and thought content normal.          Assessment & Plan:  Well exam. He has some vertigo. I suggested he take Allegra every day. Recheck prn

## 2014-07-23 IMAGING — CR DG LUMBAR SPINE COMPLETE 4+V
5 series · 5 of 5 positions shown · non-contrast
Comparison: None.

CLINICAL DATA: Low back pain for 2 months.  No injury.

LUMBAR SPINE - COMPLETE 4+ VIEW

[view not recorded (1 of 5)]
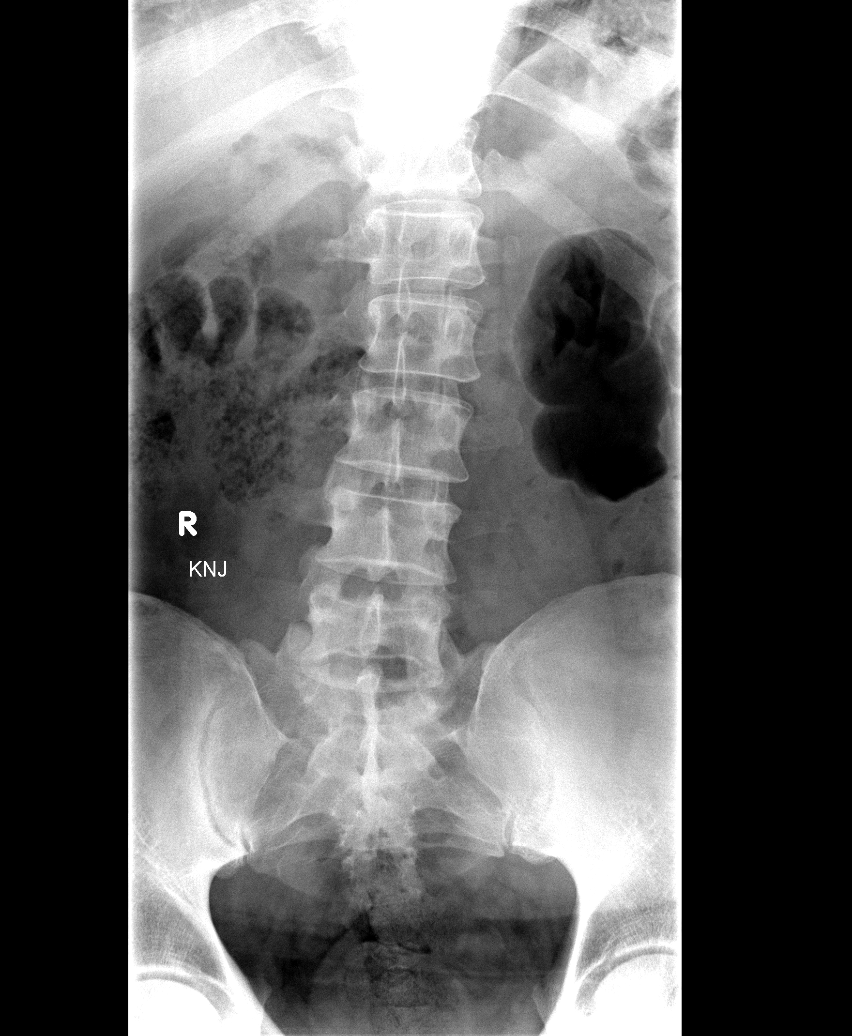

[view not recorded (2 of 5)]
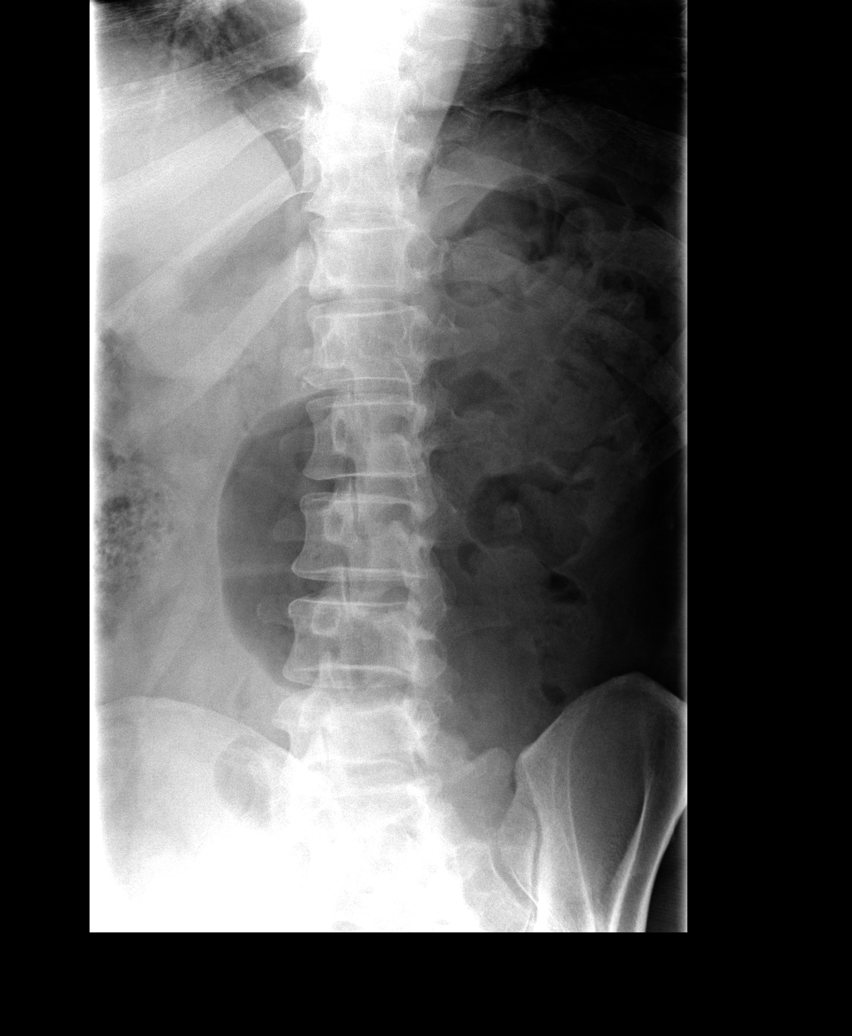

[view not recorded (3 of 5)]
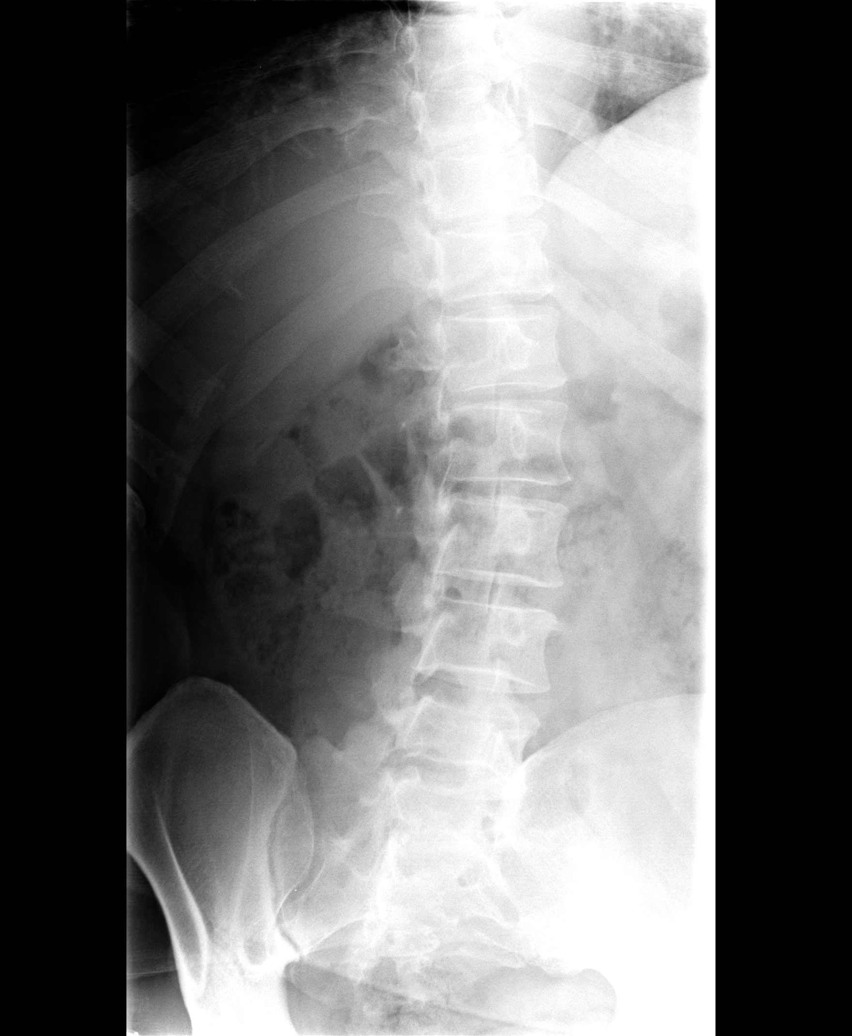

[view not recorded (4 of 5)]
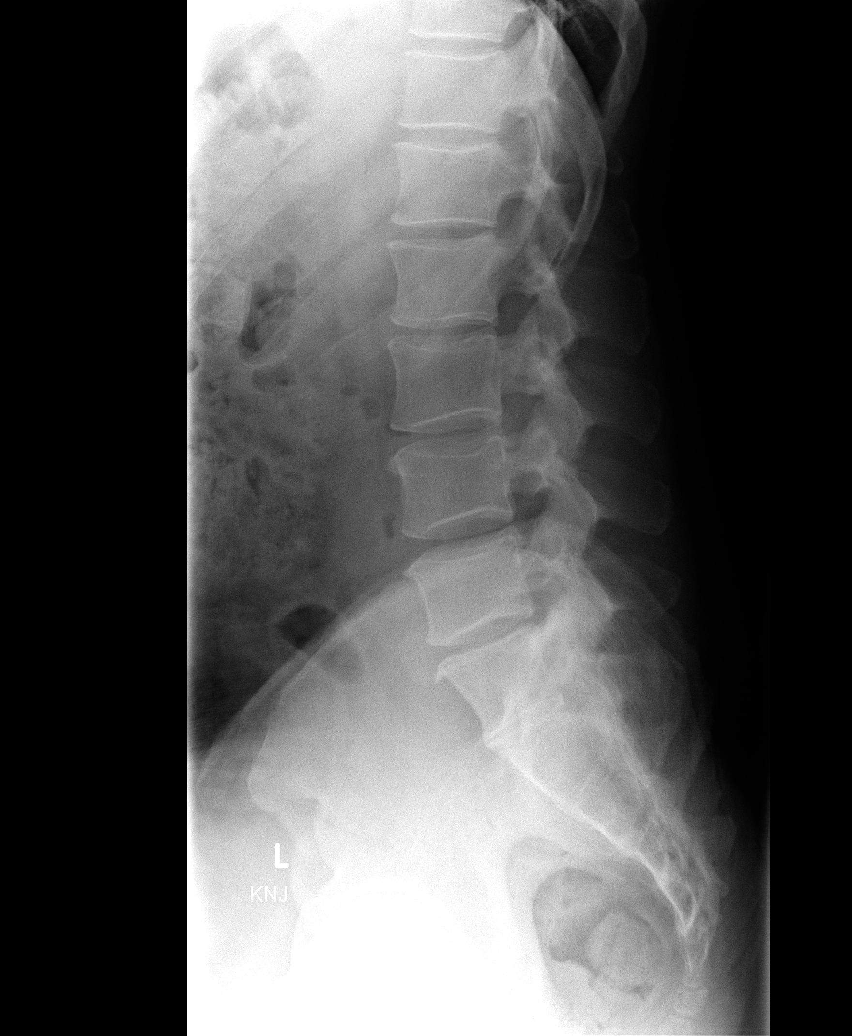

[view not recorded (5 of 5)]
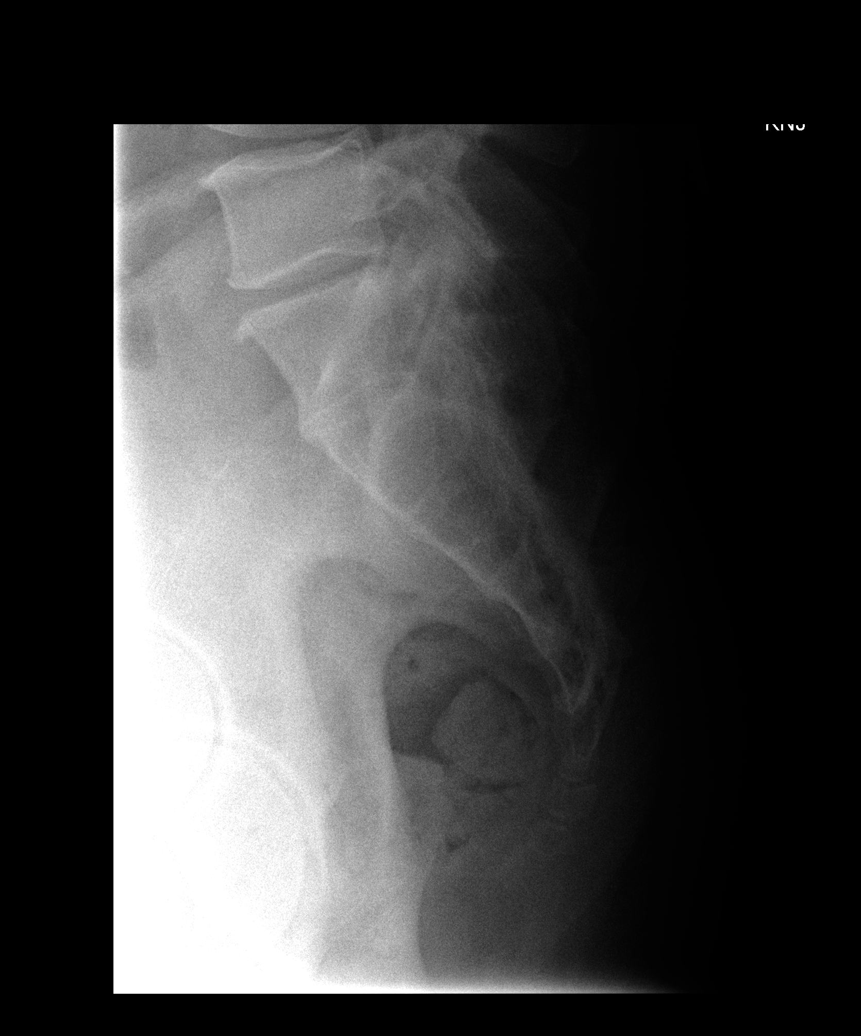

[5 of 5 positions shown; findings below may reference images not displayed]

FINDINGS: There is an anomalous lumbosacral spine with a partially
lumbarized S1 vertebra.  The films have been labeled accordingly.
There is mild interspace narrowing present at the L5- S1 level with
osteophytic change.  There are no fractures, subluxations, or
destructive changes.
IMPRESSION: Anomalous lumbosacral spine as discussed above.  Mild L5 -S1
degenerative disc disease.

## 2015-01-14 ENCOUNTER — Other Ambulatory Visit (INDEPENDENT_AMBULATORY_CARE_PROVIDER_SITE_OTHER): Payer: BLUE CROSS/BLUE SHIELD

## 2015-01-14 DIAGNOSIS — Z0000 Encounter for general adult medical examination without abnormal findings: Secondary | ICD-10-CM | POA: Diagnosis not present

## 2015-01-14 LAB — POCT URINALYSIS DIPSTICK
Bilirubin, UA: NEGATIVE
Glucose, UA: NEGATIVE
KETONES UA: NEGATIVE
Leukocytes, UA: NEGATIVE
Nitrite, UA: NEGATIVE
PH UA: 6
PROTEIN UA: NEGATIVE
SPEC GRAV UA: 1.025
UROBILINOGEN UA: 1

## 2015-01-14 LAB — CBC WITH DIFFERENTIAL/PLATELET
BASOS ABS: 0 10*3/uL (ref 0.0–0.1)
Basophils Relative: 0.6 % (ref 0.0–3.0)
Eosinophils Absolute: 0.3 10*3/uL (ref 0.0–0.7)
Eosinophils Relative: 3.3 % (ref 0.0–5.0)
HCT: 49.8 % (ref 39.0–52.0)
Hemoglobin: 16.7 g/dL (ref 13.0–17.0)
LYMPHS ABS: 3.5 10*3/uL (ref 0.7–4.0)
Lymphocytes Relative: 41 % (ref 12.0–46.0)
MCHC: 33.5 g/dL (ref 30.0–36.0)
MCV: 88.5 fl (ref 78.0–100.0)
MONOS PCT: 7.3 % (ref 3.0–12.0)
Monocytes Absolute: 0.6 10*3/uL (ref 0.1–1.0)
NEUTROS ABS: 4.1 10*3/uL (ref 1.4–7.7)
NEUTROS PCT: 47.8 % (ref 43.0–77.0)
PLATELETS: 208 10*3/uL (ref 150.0–400.0)
RBC: 5.62 Mil/uL (ref 4.22–5.81)
RDW: 13.4 % (ref 11.5–15.5)
WBC: 8.6 10*3/uL (ref 4.0–10.5)

## 2015-01-14 LAB — HEPATIC FUNCTION PANEL
ALBUMIN: 4.6 g/dL (ref 3.5–5.2)
ALK PHOS: 76 U/L (ref 39–117)
ALT: 16 U/L (ref 0–53)
AST: 17 U/L (ref 0–37)
Bilirubin, Direct: 0.1 mg/dL (ref 0.0–0.3)
Total Bilirubin: 0.6 mg/dL (ref 0.2–1.2)
Total Protein: 7.5 g/dL (ref 6.0–8.3)

## 2015-01-14 LAB — BASIC METABOLIC PANEL
BUN: 9 mg/dL (ref 6–23)
CALCIUM: 9.8 mg/dL (ref 8.4–10.5)
CO2: 28 meq/L (ref 19–32)
Chloride: 104 mEq/L (ref 96–112)
Creatinine, Ser: 0.97 mg/dL (ref 0.40–1.50)
GFR: 105.12 mL/min (ref >60.00)
GLUCOSE: 101 mg/dL — AB (ref 70–99)
Potassium: 4.4 mEq/L (ref 3.5–5.1)
SODIUM: 141 meq/L (ref 135–145)

## 2015-01-14 LAB — LIPID PANEL
CHOLESTEROL: 155 mg/dL (ref 0–200)
HDL: 31.9 mg/dL — ABNORMAL LOW (ref >39.00)
LDL Cholesterol: 107 mg/dL — ABNORMAL HIGH (ref 0–99)
NONHDL: 123.36
Total CHOL/HDL Ratio: 5
Triglycerides: 81 mg/dL (ref 0.0–149.0)
VLDL: 16.2 mg/dL (ref 0.0–40.0)

## 2015-01-14 LAB — PSA: PSA: 2.56 ng/mL (ref 0.10–4.00)

## 2015-01-14 LAB — TSH: TSH: 0.73 u[IU]/mL (ref 0.35–4.50)

## 2015-01-18 ENCOUNTER — Encounter: Payer: Self-pay | Admitting: Family Medicine

## 2015-01-18 ENCOUNTER — Ambulatory Visit (INDEPENDENT_AMBULATORY_CARE_PROVIDER_SITE_OTHER): Payer: BLUE CROSS/BLUE SHIELD | Admitting: Family Medicine

## 2015-01-18 VITALS — BP 150/99 | HR 75 | Temp 98.5°F | Ht 70.75 in | Wt 208.0 lb

## 2015-01-18 DIAGNOSIS — Z0000 Encounter for general adult medical examination without abnormal findings: Secondary | ICD-10-CM

## 2015-01-18 MED ORDER — LISINOPRIL-HYDROCHLOROTHIAZIDE 10-12.5 MG PO TABS: 1.0000 | ORAL_TABLET | Freq: Every day | ORAL | Status: DC

## 2015-01-18 NOTE — Progress Notes (Signed)
   Subjective:    Patient ID: Guy Houston, male    DOB: [birth year], 49 y.o.   MRN: [id number]  HPI 49 yr old male for a cpx. He feels well. 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    Review of Systems  Constitutional: Negative.   HENT: Negative.   Eyes: Negative.   Respiratory: Negative.   Cardiovascular: Negative.   Gastrointestinal: Negative.   Genitourinary: Negative.   Musculoskeletal: Negative.   Skin: Negative.   Neurological: Negative.   Psychiatric/Behavioral: Negative.        Objective:   Physical Exam  Constitutional: He is oriented to person, place, and time. He appears well-developed and well-nourished. No distress.  HENT:  Head: Normocephalic and atraumatic.  Right Ear: External ear normal.  Left Ear: External ear normal.  Nose: Nose normal.  Mouth/Throat: Oropharynx is clear and moist. No oropharyngeal exudate.  Eyes: Conjunctivae and EOM are normal. Pupils are equal, round, and reactive to light. Right eye exhibits no discharge. Left eye exhibits no discharge. No scleral icterus.  Neck: Neck supple. No JVD present. No tracheal deviation present. No thyromegaly present.  Cardiovascular: Normal rate, regular rhythm, normal heart sounds and intact distal pulses.  Exam reveals no gallop and no friction rub.   No murmur heard. EKG normal   Pulmonary/Chest: Effort normal and breath sounds normal. No respiratory distress. He has no wheezes. He has no rales. He exhibits no tenderness.  Abdominal: Soft. Bowel sounds are normal. He exhibits no distension and no mass. There is no tenderness. There is no rebound and no guarding.  Genitourinary: Rectum normal, prostate normal and penis normal. Guaiac negative stool. No penile tenderness.  Musculoskeletal: Normal range of motion. He exhibits no edema or tenderness.  Lymphadenopathy:    He has no cervical adenopathy.  Neurological: He is alert and oriented to person, place, and time. He has  normal reflexes. No cranial nerve deficit. He exhibits normal muscle tone. Coordination normal.  Skin: Skin is warm and dry. No rash noted. He is not diaphoretic. No erythema. No pallor.  Psychiatric: He has a normal mood and affect. His behavior is normal. Judgment and thought content normal.          Assessment & Plan:  Well exam. We discussed diet and exercise. Start on Lisinopril HCT for the HTN, and recheck in one month

## 2015-01-18 NOTE — Progress Notes (Signed)
Pre visit review using our clinic review tool, if applicable. No additional management support is needed unless otherwise documented below in the visit note. 

## 2015-01-31 ENCOUNTER — Encounter: Payer: Self-pay | Admitting: Gastroenterology

## 2015-03-29 ENCOUNTER — Ambulatory Visit (AMBULATORY_SURGERY_CENTER): Payer: Self-pay

## 2015-03-29 VITALS — Ht 71.0 in | Wt 212.2 lb

## 2015-03-29 DIAGNOSIS — Z1211 Encounter for screening for malignant neoplasm of colon: Secondary | ICD-10-CM

## 2015-03-29 MED ORDER — SUPREP BOWEL PREP KIT 17.5-3.13-1.6 GM/177ML PO SOLN: 1.0000 | Freq: Once | ORAL | Status: DC

## 2015-03-29 NOTE — Progress Notes (Signed)
No allergies to eggs or soy No past exposure to anesthesia No home oxygen No diet/weight loss meds  Has email and internet; watched emmi with wife

## 2015-04-11 ENCOUNTER — Ambulatory Visit (AMBULATORY_SURGERY_CENTER): Payer: BLUE CROSS/BLUE SHIELD | Admitting: Gastroenterology

## 2015-04-11 ENCOUNTER — Encounter: Payer: Self-pay | Admitting: Gastroenterology

## 2015-04-11 VITALS — BP 128/77 | HR 60 | Temp 97.4°F | Resp 15 | Ht 71.0 in | Wt 212.0 lb

## 2015-04-11 DIAGNOSIS — K635 Polyp of colon: Secondary | ICD-10-CM

## 2015-04-11 DIAGNOSIS — D124 Benign neoplasm of descending colon: Secondary | ICD-10-CM | POA: Diagnosis not present

## 2015-04-11 DIAGNOSIS — Z1211 Encounter for screening for malignant neoplasm of colon: Secondary | ICD-10-CM | POA: Diagnosis not present

## 2015-04-11 HISTORY — PX: COLONOSCOPY: SHX174

## 2015-04-11 MED ORDER — SODIUM CHLORIDE 0.9 % IV SOLN: 500.0000 mL | INTRAVENOUS | Status: DC

## 2015-04-11 NOTE — Progress Notes (Signed)
Called to room to assist during endoscopic procedure.  Patient ID and intended procedure confirmed with present staff. Received instructions for my participation in the procedure from the performing physician.  

## 2015-04-11 NOTE — Patient Instructions (Signed)
YOU HAD AN ENDOSCOPIC PROCEDURE TODAY AT THE [facility]:   Refer to the procedure report that was given to you for any specific questions about what was found during the examination.  If the procedure report does not answer your questions, please call your gastroenterologist to clarify.  If you requested that your care partner not be given the details of your procedure findings, then the procedure report has been included in a sealed envelope for you to review at your convenience later.  YOU SHOULD EXPECT: Some feelings of bloating in the abdomen. Passage of more gas than usual.  Walking can help get rid of the air that was put into your GI tract during the procedure and reduce the bloating. If you had a lower endoscopy (such as a colonoscopy or flexible sigmoidoscopy) you may notice spotting of blood in your stool or on the toilet paper. If you underwent a bowel prep for your procedure, you may not have a normal bowel movement for a few days.  Please Note:  You might notice some irritation and congestion in your nose or some drainage.  This is from the oxygen used during your procedure.  There is no need for concern and it should clear up in a day or so.  SYMPTOMS TO REPORT IMMEDIATELY:   Following lower endoscopy (colonoscopy or flexible sigmoidoscopy):  Excessive amounts of blood in the stool  Significant tenderness or worsening of abdominal pains  Swelling of the abdomen that is new, acute  Fever of 100F or higher    For urgent or emergent issues, a gastroenterologist can be reached at any hour by calling [phone].   DIET: Your first meal following the procedure should be a small meal and then it is ok to progress to your normal diet. Heavy or fried foods are harder to digest and may make you feel nauseous or bloated.  Likewise, meals heavy in dairy and vegetables can increase bloating.  Drink plenty of fluids but you should avoid alcoholic beverages for 24  hours.  ACTIVITY:  You should plan to take it easy for the rest of today and you should NOT DRIVE or use heavy machinery until tomorrow (because of the sedation medicines used during the test).    FOLLOW UP: Our staff will call the number listed on your records the next business day following your procedure to check on you and address any questions or concerns that you may have regarding the information given to you following your procedure. If we do not reach you, we will leave a message.  However, if you are feeling well and you are not experiencing any problems, there is no need to return our call.  We will assume that you have returned to your regular daily activities without incident.  If any biopsies were taken you will be contacted by phone or by letter within the next 1-3 weeks.  Please call us at [phone] if you have not heard about the biopsies in 3 weeks.    SIGNATURES/CONFIDENTIALITY: You and/or your care partner have signed paperwork which will be entered into your electronic medical record.  These signatures attest to the fact that that the information above on your After Visit Summary has been reviewed and is understood.  Full responsibility of the confidentiality of this discharge information lies with you and/or your care-partner   Information on polyps and hemorrhoids given to you today        

## 2015-04-11 NOTE — Progress Notes (Signed)
Report to PACU, RN, vss, BBS= Clear.  

## 2015-04-11 NOTE — Op Note (Signed)
[place]  [address] [place], [id number]   COLONOSCOPY PROCEDURE REPORT  PATIENT: Guy Houston, Guy Houston  MR#: [id number] BIRTHDATE: 1965/04/09 , 49  yrs. old GENDER: male ENDOSCOPIST: Ladene Artist, MD, [facility] REFERRED BY:  Laurey Morale PROCEDURE DATE:  04/11/2015 PROCEDURE:   Colonoscopy, screening and Colonoscopy with snare polypectomy First Screening Colonoscopy - Avg.  risk and is 50 yrs.  old or older Yes.  Prior Negative Screening - Now for repeat screening. N/A  History of Adenoma - Now for follow-up colonoscopy & has been > or = to 3 yrs.  N/A  Polyps removed today? Yes ASA CLASS:   Class II INDICATIONS:Screening for colonic neoplasia. MEDICATIONS: Monitored anesthesia care and Propofol 300 mg IV DESCRIPTION OF PROCEDURE:   After the risks benefits and alternatives of the procedure were thoroughly explained, informed consent was obtained.  The digital rectal exam revealed no abnormalities of the rectum.   The LB [id number]  endoscope was introduced through the anus and advanced to the cecum, which was identified by both the appendix and ileocecal valve. No adverse events experienced.   The quality of the prep was excellent. (Suprep was used)  The instrument was then slowly withdrawn as the colon was fully examined. Estimated blood loss is zero unless otherwise noted in this procedure report.    COLON FINDINGS: A sessile polyp measuring 6 mm in size was found in the descending colon.  A polypectomy was performed with a cold snare.  The resection was complete, the polyp tissue was completely retrieved and sent to histology.   The colonic mucosa appeared normal at the splenic flexure, in the transverse colon, rectum, sigmoid colon, at the ileocecal valve, cecum, hepatic flexure, and in the ascending colon.  Retroflexed views revealed internal Grade I hemorrhoids. The time to cecum = 1.8 Withdrawal time = 12.1   The scope was withdrawn and the  procedure completed. COMPLICATIONS: There were no immediate complications.  ENDOSCOPIC IMPRESSION: 1.   Sessile polyp in the descending colon; polypectomy performed with a cold snare 2.   Grade l internal hemorrhoids  RECOMMENDATIONS: 1.  Await pathology results 2.  Repeat colonoscopy in 5 years if polyp adenomatous; otherwise 10 years  eSigned:  Ladene Artist, MD, [facility] 04/11/2015 8:51 AM

## 2015-04-12 ENCOUNTER — Telehealth: Payer: Self-pay | Admitting: *Deleted

## 2015-04-12 NOTE — Telephone Encounter (Signed)
Message left

## 2015-04-18 ENCOUNTER — Encounter: Payer: Self-pay | Admitting: Gastroenterology

## 2015-07-29 ENCOUNTER — Other Ambulatory Visit: Payer: Self-pay | Admitting: Family Medicine

## 2016-02-03 ENCOUNTER — Other Ambulatory Visit (INDEPENDENT_AMBULATORY_CARE_PROVIDER_SITE_OTHER): Payer: BLUE CROSS/BLUE SHIELD

## 2016-02-03 DIAGNOSIS — Z0000 Encounter for general adult medical examination without abnormal findings: Secondary | ICD-10-CM | POA: Diagnosis not present

## 2016-02-03 LAB — POC URINALSYSI DIPSTICK (AUTOMATED)
BILIRUBIN UA: NEGATIVE
GLUCOSE UA: NEGATIVE
KETONES UA: NEGATIVE
Leukocytes, UA: NEGATIVE
Nitrite, UA: NEGATIVE
Protein, UA: 6
SPEC GRAV UA: 1.02
UROBILINOGEN UA: 0.2
pH, UA: 6

## 2016-02-03 LAB — BASIC METABOLIC PANEL
BUN: 11 mg/dL (ref 6–23)
CALCIUM: 9.4 mg/dL (ref 8.4–10.5)
CO2: 32 mEq/L (ref 19–32)
CREATININE: 1 mg/dL (ref 0.40–1.50)
Chloride: 103 mEq/L (ref 96–112)
GFR: 101.07 mL/min (ref >60.00)
Glucose, Bld: 87 mg/dL (ref 70–99)
Potassium: 3.8 mEq/L (ref 3.5–5.1)
SODIUM: 140 meq/L (ref 135–145)

## 2016-02-03 LAB — CBC WITH DIFFERENTIAL/PLATELET
BASOS ABS: 0 10*3/uL (ref 0.0–0.1)
Basophils Relative: 0.3 % (ref 0.0–3.0)
EOS ABS: 0.6 10*3/uL (ref 0.0–0.7)
Eosinophils Relative: 5 % (ref 0.0–5.0)
HEMATOCRIT: 50.7 % (ref 39.0–52.0)
Hemoglobin: 16.9 g/dL (ref 13.0–17.0)
LYMPHS ABS: 4.1 10*3/uL — AB (ref 0.7–4.0)
LYMPHS PCT: 35.1 % (ref 12.0–46.0)
MCHC: 33.4 g/dL (ref 30.0–36.0)
MCV: 89.8 fl (ref 78.0–100.0)
MONOS PCT: 7.2 % (ref 3.0–12.0)
Monocytes Absolute: 0.8 10*3/uL (ref 0.1–1.0)
NEUTROS ABS: 6.1 10*3/uL (ref 1.4–7.7)
NEUTROS PCT: 52.4 % (ref 43.0–77.0)
PLATELETS: 218 10*3/uL (ref 150.0–400.0)
RBC: 5.65 Mil/uL (ref 4.22–5.81)
RDW: 13.7 % (ref 11.5–15.5)
WBC: 11.6 10*3/uL — ABNORMAL HIGH (ref 4.0–10.5)

## 2016-02-03 LAB — LIPID PANEL
CHOL/HDL RATIO: 5
Cholesterol: 164 mg/dL (ref 0–200)
HDL: 34.5 mg/dL — AB (ref >39.00)
LDL Cholesterol: 93 mg/dL (ref 0–99)
NONHDL: 129.16
Triglycerides: 181 mg/dL — ABNORMAL HIGH (ref 0.0–149.0)
VLDL: 36.2 mg/dL (ref 0.0–40.0)

## 2016-02-03 LAB — HEPATIC FUNCTION PANEL
ALBUMIN: 4.3 g/dL (ref 3.5–5.2)
ALK PHOS: 77 U/L (ref 39–117)
ALT: 22 U/L (ref 0–53)
AST: 19 U/L (ref 0–37)
BILIRUBIN DIRECT: 0.1 mg/dL (ref 0.0–0.3)
Total Bilirubin: 0.5 mg/dL (ref 0.2–1.2)
Total Protein: 7.2 g/dL (ref 6.0–8.3)

## 2016-02-03 LAB — PSA: PSA: 3.74 ng/mL (ref 0.10–4.00)

## 2016-02-03 LAB — TSH: TSH: 1.06 u[IU]/mL (ref 0.35–4.50)

## 2016-02-06 ENCOUNTER — Encounter: Payer: Self-pay | Admitting: Family Medicine

## 2016-02-10 ENCOUNTER — Ambulatory Visit (INDEPENDENT_AMBULATORY_CARE_PROVIDER_SITE_OTHER): Payer: BLUE CROSS/BLUE SHIELD | Admitting: Family Medicine

## 2016-02-10 ENCOUNTER — Encounter: Payer: Self-pay | Admitting: Family Medicine

## 2016-02-10 VITALS — BP 150/94 | HR 78 | Temp 98.4°F | Ht 71.0 in | Wt 217.0 lb

## 2016-02-10 DIAGNOSIS — Z0000 Encounter for general adult medical examination without abnormal findings: Secondary | ICD-10-CM | POA: Diagnosis not present

## 2016-02-10 MED ORDER — LISINOPRIL 10 MG PO TABS: 10.0000 mg | ORAL_TABLET | Freq: Every day | ORAL | 3 refills | Status: DC

## 2016-02-10 NOTE — Progress Notes (Signed)
   Subjective:    Patient ID: Guy Houston, male    DOB: 01/29/1966, 50 y.o.   MRN: [id number]  HPI 50 yr old male for a well exam. He feels well now but he admits to stopping his BP med about 2 months ago. He reports that his BP had been dropping too low on it and he felt weak.    Review of Systems  Constitutional: Negative.   HENT: Negative.   Eyes: Negative.   Respiratory: Negative.   Cardiovascular: Negative.   Gastrointestinal: Negative.   Genitourinary: Negative.   Musculoskeletal: Negative.   Skin: Negative.   Neurological: Negative.   Psychiatric/Behavioral: Negative.        Objective:   Physical Exam  Constitutional: He is oriented to person, place, and time. He appears well-developed and well-nourished. No distress.  HENT:  Head: Normocephalic and atraumatic.  Right Ear: External ear normal.  Left Ear: External ear normal.  Nose: Nose normal.  Mouth/Throat: Oropharynx is clear and moist. No oropharyngeal exudate.  Eyes: Conjunctivae and EOM are normal. Pupils are equal, round, and reactive to light. Right eye exhibits no discharge. Left eye exhibits no discharge. No scleral icterus.  Neck: Neck supple. No JVD present. No tracheal deviation present. No thyromegaly present.  Cardiovascular: Normal rate, regular rhythm, normal heart sounds and intact distal pulses.  Exam reveals no gallop and no friction rub.   No murmur heard. Pulmonary/Chest: Effort normal and breath sounds normal. No respiratory distress. He has no wheezes. He has no rales. He exhibits no tenderness.  Abdominal: Soft. Bowel sounds are normal. He exhibits no distension and no mass. There is no tenderness. There is no rebound and no guarding.  Genitourinary: Rectum normal, prostate normal and penis normal. Rectal exam shows guaiac negative stool. No penile tenderness.  Musculoskeletal: Normal range of motion. He exhibits no edema or tenderness.  Lymphadenopathy:    He has no cervical adenopathy.    Neurological: He is alert and oriented to person, place, and time. He has normal reflexes. No cranial nerve deficit. He exhibits normal muscle tone. Coordination normal.  Skin: Skin is warm and dry. No rash noted. He is not diaphoretic. No erythema. No pallor.  Psychiatric: He has a normal mood and affect. His behavior is normal. Judgment and thought content normal.          Assessment & Plan:  Well exam. We discussed diet and exercise. We will start him on Lisinopril 10 mg daily and he will monitor the BP at home. I think by taking away the diuretic he will feel better.  Laurey Morale, MD

## 2016-02-14 ENCOUNTER — Other Ambulatory Visit: Payer: BLUE CROSS/BLUE SHIELD

## 2016-02-21 ENCOUNTER — Encounter: Payer: BLUE CROSS/BLUE SHIELD | Admitting: Family Medicine

## 2016-11-15 ENCOUNTER — Encounter: Payer: Self-pay | Admitting: Family Medicine

## 2016-12-20 ENCOUNTER — Ambulatory Visit (INDEPENDENT_AMBULATORY_CARE_PROVIDER_SITE_OTHER): Payer: BLUE CROSS/BLUE SHIELD | Admitting: *Deleted

## 2016-12-20 DIAGNOSIS — Z23 Encounter for immunization: Secondary | ICD-10-CM

## 2017-02-11 ENCOUNTER — Encounter: Payer: Self-pay | Admitting: Family Medicine

## 2017-02-11 ENCOUNTER — Ambulatory Visit (INDEPENDENT_AMBULATORY_CARE_PROVIDER_SITE_OTHER): Payer: BLUE CROSS/BLUE SHIELD | Admitting: Family Medicine

## 2017-02-11 VITALS — BP 144/98 | HR 79 | Temp 98.7°F | Ht 70.75 in | Wt 226.2 lb

## 2017-02-11 DIAGNOSIS — M21921 Unspecified acquired deformity of right upper arm: Secondary | ICD-10-CM

## 2017-02-11 DIAGNOSIS — Z0000 Encounter for general adult medical examination without abnormal findings: Secondary | ICD-10-CM | POA: Diagnosis not present

## 2017-02-11 DIAGNOSIS — Z125 Encounter for screening for malignant neoplasm of prostate: Secondary | ICD-10-CM

## 2017-02-11 LAB — BASIC METABOLIC PANEL
BUN: 11 mg/dL (ref 6–23)
CALCIUM: 9.6 mg/dL (ref 8.4–10.5)
CO2: 30 mEq/L (ref 19–32)
Chloride: 104 mEq/L (ref 96–112)
Creatinine, Ser: 1.02 mg/dL (ref 0.40–1.50)
GFR: 98.39 mL/min (ref >60.00)
Glucose, Bld: 88 mg/dL (ref 70–99)
POTASSIUM: 4.4 meq/L (ref 3.5–5.1)
SODIUM: 140 meq/L (ref 135–145)

## 2017-02-11 LAB — HEPATIC FUNCTION PANEL
ALK PHOS: 75 U/L (ref 39–117)
ALT: 19 U/L (ref 0–53)
AST: 17 U/L (ref 0–37)
Albumin: 4.5 g/dL (ref 3.5–5.2)
BILIRUBIN DIRECT: 0.1 mg/dL (ref 0.0–0.3)
BILIRUBIN TOTAL: 0.5 mg/dL (ref 0.2–1.2)
Total Protein: 7.5 g/dL (ref 6.0–8.3)

## 2017-02-11 LAB — CBC WITH DIFFERENTIAL/PLATELET
BASOS PCT: 0.5 % (ref 0.0–3.0)
Basophils Absolute: 0 10*3/uL (ref 0.0–0.1)
EOS PCT: 2.9 % (ref 0.0–5.0)
Eosinophils Absolute: 0.2 10*3/uL (ref 0.0–0.7)
HEMATOCRIT: 49 % (ref 39.0–52.0)
HEMOGLOBIN: 16.8 g/dL (ref 13.0–17.0)
LYMPHS PCT: 34.6 % (ref 12.0–46.0)
Lymphs Abs: 2.6 10*3/uL (ref 0.7–4.0)
MCHC: 34.2 g/dL (ref 30.0–36.0)
MCV: 89.2 fl (ref 78.0–100.0)
MONO ABS: 0.6 10*3/uL (ref 0.1–1.0)
MONOS PCT: 7.4 % (ref 3.0–12.0)
Neutro Abs: 4 10*3/uL (ref 1.4–7.7)
Neutrophils Relative %: 54.6 % (ref 43.0–77.0)
Platelets: 197 10*3/uL (ref 150.0–400.0)
RBC: 5.49 Mil/uL (ref 4.22–5.81)
RDW: 13.4 % (ref 11.5–15.5)
WBC: 7.4 10*3/uL (ref 4.0–10.5)

## 2017-02-11 LAB — TSH: TSH: 0.9 u[IU]/mL (ref 0.35–4.50)

## 2017-02-11 LAB — POC URINALSYSI DIPSTICK (AUTOMATED)
Bilirubin, UA: NEGATIVE
GLUCOSE UA: NEGATIVE
Ketones, UA: NEGATIVE
LEUKOCYTES UA: NEGATIVE
NITRITE UA: NEGATIVE
PROTEIN UA: NEGATIVE
Spec Grav, UA: 1.025 (ref 1.010–1.025)
UROBILINOGEN UA: 0.2 U/dL
pH, UA: 6 (ref 5.0–8.0)

## 2017-02-11 LAB — LIPID PANEL
CHOLESTEROL: 139 mg/dL (ref 0–200)
HDL: 33.8 mg/dL — ABNORMAL LOW (ref >39.00)
LDL CALC: 81 mg/dL (ref 0–99)
NONHDL: 105.27
Total CHOL/HDL Ratio: 4
Triglycerides: 123 mg/dL (ref 0.0–149.0)
VLDL: 24.6 mg/dL (ref 0.0–40.0)

## 2017-02-11 LAB — PSA: PSA: 3.23 ng/mL (ref 0.10–4.00)

## 2017-02-11 MED ORDER — LISINOPRIL 10 MG PO TABS: 10.0000 mg | ORAL_TABLET | Freq: Every day | ORAL | 3 refills | Status: DC

## 2017-02-11 MED ORDER — LANSOPRAZOLE 15 MG PO CPDR: 15.0000 mg | DELAYED_RELEASE_CAPSULE | ORAL | 3 refills | Status: DC | PRN

## 2017-02-11 NOTE — Progress Notes (Signed)
   Subjective:    Patient ID: Guy [place], male    DOB: 01-31-1966, 51 y.o.   MRN: [id number]  HPI Here for a well exam. He feels well but he asks [place] to look at his right elbow. He broke this arm when he was 51 years old and he has slightly limited flexion of it. There is no pain. However over the last year a knot has appeared that worries him. His BP at home is stable.    Review of Systems  Constitutional: Negative.   HENT: Negative.   Eyes: Negative.   Respiratory: Negative.   Cardiovascular: Negative.   Gastrointestinal: Negative.   Genitourinary: Negative.   Musculoskeletal: Negative.   Skin: Negative.   Neurological: Negative.   Psychiatric/Behavioral: Negative.        Objective:   Physical Exam  Constitutional: He is oriented to person, place, and time. He appears well-developed and well-nourished. No distress.  HENT:  Head: Normocephalic and atraumatic.  Right Ear: External ear normal.  Left Ear: External ear normal.  Nose: Nose normal.  Mouth/Throat: Oropharynx is clear and moist. No oropharyngeal exudate.  Eyes: Conjunctivae and EOM are normal. Pupils are equal, round, and reactive to light. Right eye exhibits no discharge. Left eye exhibits no discharge. No scleral icterus.  Neck: Neck supple. No JVD present. No tracheal deviation present. No thyromegaly present.  Cardiovascular: Normal rate, regular rhythm, normal heart sounds and intact distal pulses. Exam reveals no gallop and no friction rub.  No murmur heard. Pulmonary/Chest: Effort normal and breath sounds normal. No respiratory distress. He has no wheezes. He has no rales. He exhibits no tenderness.  Abdominal: Soft. Bowel sounds are normal. He exhibits no distension and no mass. There is no tenderness. There is no rebound and no guarding.  Genitourinary: Rectum normal, prostate normal and penis normal. Rectal exam shows guaiac negative stool. No penile tenderness.  Musculoskeletal: Normal range of motion. He  exhibits no edema or tenderness.  The right lateral elbow has a round firm nontender lump about 1 cm in diameter  Lymphadenopathy:    He has no cervical adenopathy.  Neurological: He is alert and oriented to person, place, and time. He has normal reflexes. No cranial nerve deficit. He exhibits normal muscle tone. Coordination normal.  Skin: Skin is warm and dry. No rash noted. He is not diaphoretic. No erythema. No pallor.  Psychiatric: He has a normal mood and affect. His behavior is normal. Judgment and thought content normal.          Assessment & Plan:  Well exam. We discussed diet and exercise. Get fasting labs. The elbow lesion seems to be a bony cyst, but we will get an Xray of it to be sure.  Alysia Penna, MD

## 2017-02-13 ENCOUNTER — Ambulatory Visit (INDEPENDENT_AMBULATORY_CARE_PROVIDER_SITE_OTHER)
Admission: RE | Admit: 2017-02-13 | Discharge: 2017-02-13 | Disposition: A | Discharge status: Home / Self Care | Payer: BLUE CROSS/BLUE SHIELD | Source: Ambulatory Visit | Attending: Family Medicine | Admitting: Family Medicine

## 2017-02-13 DIAGNOSIS — M21921 Unspecified acquired deformity of right upper arm: Secondary | ICD-10-CM

## 2017-02-14 ENCOUNTER — Telehealth: Payer: Self-pay | Admitting: Family Medicine

## 2017-02-14 NOTE — Telephone Encounter (Signed)
See my Result Note for the Xray

## 2017-02-14 NOTE — Telephone Encounter (Signed)
Copied from [place] [id number]. Topic: Quick Communication - Lab Results >> Feb 13, 2017  6:22 PM Tye [place] wrote: Pt called back after missing a telephone call and asked for a call back due to some questions he has, contact pt to advise  I spoke with pt and went over recent lab results. He would like a call back when xray results are back.

## 2017-02-14 NOTE — Telephone Encounter (Signed)
Pt notified of instructions and verbalized understanding. See result notes.

## 2017-02-14 NOTE — Telephone Encounter (Signed)
Sent to PCP as an [place] pt calling about results. X-ray results are in.

## 2017-02-18 NOTE — Addendum Note (Signed)
Addended by: Alysia Penna A on: 02/18/2017 01:28 PM   Modules accepted: Orders

## 2017-02-21 ENCOUNTER — Telehealth: Payer: Self-pay | Admitting: Family Medicine

## 2017-02-21 NOTE — Telephone Encounter (Signed)
Copied from [place]. Topic: Quick Communication - Rx Refill/Question >> Feb 21, 2017  8:53 AM Lennox Solders wrote: Has the patient contacted their pharmacy? yes (Agent: If no, request that the patient contact the pharmacy for the refill.)   Preferred Pharm[facility] (with phone number [facility]  on lansoprazole reference # [id number] phone number [phone]  Agent: Please be advised that RX refills may take up to 3 business days. We ask that you follow-up with your pharmacy.

## 2017-02-22 NOTE — Telephone Encounter (Signed)
Called pharmacy and they stated that this medication is normally a daily medication. Need clarification show Directions be take 1 capsule once daily or  just once daily as needed?  Sent to PCP clarification

## 2017-02-22 NOTE — Telephone Encounter (Signed)
Last OV 02/11/2017. Rx was last refilled 02/11/2017 disp 90 with 3 refills. Take one capsule daily as needed. Call pharmacy to find out what clarification they need.

## 2017-02-25 ENCOUNTER — Ambulatory Visit (INDEPENDENT_AMBULATORY_CARE_PROVIDER_SITE_OTHER): Payer: BLUE CROSS/BLUE SHIELD | Admitting: Family Medicine

## 2017-02-25 ENCOUNTER — Encounter: Payer: Self-pay | Admitting: Family Medicine

## 2017-02-25 VITALS — BP 136/70 | HR 63 | Temp 98.6°F | Wt 226.2 lb

## 2017-02-25 DIAGNOSIS — R05 Cough: Secondary | ICD-10-CM

## 2017-02-25 DIAGNOSIS — T464X5A Adverse effect of angiotensin-converting-enzyme inhibitors, initial encounter: Secondary | ICD-10-CM

## 2017-02-25 DIAGNOSIS — I10 Essential (primary) hypertension: Secondary | ICD-10-CM | POA: Diagnosis not present

## 2017-02-25 MED ORDER — AMLODIPINE BESYLATE 5 MG PO TABS: 5.0000 mg | ORAL_TABLET | Freq: Every day | ORAL | 3 refills | Status: DC

## 2017-02-25 MED ORDER — HYDROCODONE-HOMATROPINE 5-1.5 MG/5ML PO SYRP: 5.0000 mL | ORAL_SOLUTION | ORAL | 0 refills | Status: DC | PRN

## 2017-02-25 MED ORDER — LANSOPRAZOLE 15 MG PO CPDR: DELAYED_RELEASE_CAPSULE | ORAL | 3 refills | Status: DC

## 2017-02-25 NOTE — Telephone Encounter (Signed)
Called the pharmacy to advise them of the correction needed. They stated that since it has been 10 days they have canceled the ordinally Rx and will need a new Rx sent in today. Rx sent with corrections made.

## 2017-02-25 NOTE — Telephone Encounter (Signed)
It should be once daily

## 2017-02-25 NOTE — Progress Notes (Signed)
   Subjective:    Patient ID: Guy [place], male    DOB: 06/28/1965, 51 y.o.   MRN: [id number]  HPI Here for 4 days of a persistent dry tickling cough in the throat. No other URI symptoms. Using Delsym. He has been on Lisinopril for about one year.    Review of Systems  Constitutional: Negative.   HENT: Negative.   Eyes: Negative.   Respiratory: Positive for cough. Negative for chest tightness, shortness of breath and wheezing.   Cardiovascular: Negative.        Objective:   Physical Exam  Constitutional: He appears well-developed and well-nourished. No distress.  HENT:  Right Ear: External ear normal.  Left Ear: External ear normal.  Nose: Nose normal.  Mouth/Throat: Oropharynx is clear and moist.  Eyes: Conjunctivae are normal.  Neck: No thyromegaly present.  Cardiovascular: Normal rate, regular rhythm, normal heart sounds and intact distal pulses.  Pulmonary/Chest: Effort normal and breath sounds normal. No respiratory distress. He has no wheezes. He has no rales.  Musculoskeletal: He exhibits no edema.  Lymphadenopathy:    He has no cervical adenopathy.          Assessment & Plan:  ACE inhibitor cough. He will stop the Lisinopril and start on Amlodipine 5 mg daily. He will follow his BP and report back to [place] in 3-4 weeks.  Alysia Penna, MD

## 2017-03-04 ENCOUNTER — Ambulatory Visit (INDEPENDENT_AMBULATORY_CARE_PROVIDER_SITE_OTHER): Payer: Self-pay | Admitting: Orthopaedic Surgery

## 2017-03-04 ENCOUNTER — Ambulatory Visit: Payer: BLUE CROSS/BLUE SHIELD | Admitting: Family Medicine

## 2017-03-06 ENCOUNTER — Encounter: Payer: Self-pay | Admitting: Family Medicine

## 2017-03-06 ENCOUNTER — Ambulatory Visit (INDEPENDENT_AMBULATORY_CARE_PROVIDER_SITE_OTHER): Payer: BLUE CROSS/BLUE SHIELD | Admitting: Family Medicine

## 2017-03-06 VITALS — BP 120/80 | HR 82 | Temp 98.2°F | Wt 228.8 lb

## 2017-03-06 DIAGNOSIS — I10 Essential (primary) hypertension: Secondary | ICD-10-CM | POA: Diagnosis not present

## 2017-03-06 DIAGNOSIS — R05 Cough: Secondary | ICD-10-CM | POA: Diagnosis not present

## 2017-03-06 DIAGNOSIS — R059 Cough, unspecified: Secondary | ICD-10-CM

## 2017-03-06 DIAGNOSIS — K219 Gastro-esophageal reflux disease without esophagitis: Secondary | ICD-10-CM

## 2017-03-06 MED ORDER — HYDROCOD POLST-CPM POLST ER 10-8 MG/5ML PO SUER: 5.0000 mL | Freq: Two times a day (BID) | ORAL | 0 refills | Status: DC | PRN

## 2017-03-06 NOTE — Patient Instructions (Signed)
Consider OTC Chlorpheniramine 4 mg once at night  Consider elevate head of bead about 6 inches  Get back on the Prevacid one daily.  Follow up with Dr Sarajane Jews in 2 weeks if cough no better.

## 2017-03-06 NOTE — Progress Notes (Signed)
Subjective:     Patient ID: Guy [place], male   DOB: [birth year], 52 y.o.   MRN: [id number]  HPI Patient seen with persistent cough. Duration over 2 weeks. Severe at night. Mostly nonproductive. Was seen here recently and ACE inhibitor was stopped and he was started on amlodipine. Blood pressure been stable but cough is not improved at all. He was prescribed Hydromet cough syrup but provides minimal relief. He feels he has a lot of thick mucus in the back of his throat. He tried some honey which seemed to help more than the cough medicine.   He does have history of some reflux and occasionally feels like he may be having some reflux symptoms now. Has taken Prevacid in the past but not recently.  Nonsmoker. No history of asthma. No recent appetite or weight changes. No hemoptysis. No pleuritic pain.  Past Medical History:  Diagnosis Date  . Alcohol abuse   . Allergy   . GERD (gastroesophageal reflux disease)   . Hypertension    Past Surgical History:  Procedure Laterality Date  . COLONOSCOPY  04/11/2015   per Dr. Fuller Plan, benign polyps, repeat in 10 yrs   . NO PAST SURGERIES      reports that he has quit smoking. he has never used smokeless tobacco. He reports that he does not drink alcohol or use drugs. family history includes Alcohol abuse in his unknown relative; Arthritis in his unknown relative; Diabetes in his unknown relative; Diverticulitis in his father and sister; Hypertension in his unknown relative; Stroke in his unknown relative. Allergies  Allergen Reactions  . Lisinopril Cough     Review of Systems  Constitutional: Negative for appetite change, chills, fatigue, fever and unexpected weight change.  Eyes: Negative for visual disturbance.  Respiratory: Positive for cough. Negative for chest tightness and shortness of breath.   Cardiovascular: Negative for chest pain, palpitations and leg swelling.  Neurological: Negative for dizziness, syncope, weakness,  light-headedness and headaches.       Objective:   Physical Exam  Constitutional: He appears well-developed and well-nourished.  HENT:  Mouth/Throat: Oropharynx is clear and moist.  Neck: Neck supple.  Cardiovascular: Normal rate and regular rhythm.  Pulmonary/Chest: Effort normal and breath sounds normal. No respiratory distress. He has no wheezes. He has no rales.  Lymphadenopathy:    He has no cervical adenopathy.       Assessment:     #1 Persistent cough. Not improved with discontinuation of ACE inhibitor yet. Possible components of postnasal drainage and GERD vs postviral.    #2 hypertension- stable on Amlodipine    Plan:     -Consider elevate head of bed about 6 inches -Consider trial of chlorpheniramine 4 mg daily at bedtime -Get back on Prevacid 1 daily -Avoid eating within 2-3 hours of bedtime -avoid all mint products -If cough not improved with the above wrote for Limited Tussionex 1 teaspoon daily at bedtime and cautioned about sedation -Follow-up with Dr. Sarajane Jews in 2 weeks if not improving  Eulas Post MD [facility]

## 2017-11-02 ENCOUNTER — Other Ambulatory Visit: Payer: Self-pay | Admitting: Family Medicine

## 2017-11-04 NOTE — Telephone Encounter (Signed)
Last OV 02/25/2017   Last OV

## 2017-12-12 ENCOUNTER — Ambulatory Visit (INDEPENDENT_AMBULATORY_CARE_PROVIDER_SITE_OTHER): Payer: BLUE CROSS/BLUE SHIELD | Admitting: *Deleted

## 2017-12-12 DIAGNOSIS — Z23 Encounter for immunization: Secondary | ICD-10-CM

## 2018-02-12 ENCOUNTER — Ambulatory Visit (INDEPENDENT_AMBULATORY_CARE_PROVIDER_SITE_OTHER): Payer: BLUE CROSS/BLUE SHIELD | Admitting: Family Medicine

## 2018-02-12 ENCOUNTER — Encounter: Payer: Self-pay | Admitting: Family Medicine

## 2018-02-12 VITALS — BP 140/96 | HR 82 | Temp 98.2°F | Ht 71.0 in | Wt 233.2 lb

## 2018-02-12 DIAGNOSIS — Z0000 Encounter for general adult medical examination without abnormal findings: Secondary | ICD-10-CM | POA: Diagnosis not present

## 2018-02-12 DIAGNOSIS — Z125 Encounter for screening for malignant neoplasm of prostate: Secondary | ICD-10-CM

## 2018-02-12 LAB — TSH: TSH: 0.87 u[IU]/mL (ref 0.35–4.50)

## 2018-02-12 LAB — BASIC METABOLIC PANEL
BUN: 13 mg/dL (ref 6–23)
CHLORIDE: 103 meq/L (ref 96–112)
CO2: 30 meq/L (ref 19–32)
Calcium: 9.9 mg/dL (ref 8.4–10.5)
Creatinine, Ser: 1.05 mg/dL (ref 0.40–1.50)
GFR: 94.79 mL/min (ref >60.00)
Glucose, Bld: 97 mg/dL (ref 70–99)
POTASSIUM: 4.4 meq/L (ref 3.5–5.1)
SODIUM: 139 meq/L (ref 135–145)

## 2018-02-12 LAB — CBC WITH DIFFERENTIAL/PLATELET
BASOS ABS: 0.1 10*3/uL (ref 0.0–0.1)
Basophils Relative: 0.8 % (ref 0.0–3.0)
Eosinophils Absolute: 0.5 10*3/uL (ref 0.0–0.7)
Eosinophils Relative: 5.7 % — ABNORMAL HIGH (ref 0.0–5.0)
HCT: 50.5 % (ref 39.0–52.0)
Hemoglobin: 17.3 g/dL — ABNORMAL HIGH (ref 13.0–17.0)
LYMPHS PCT: 33.6 % (ref 12.0–46.0)
Lymphs Abs: 3.1 10*3/uL (ref 0.7–4.0)
MCHC: 34.3 g/dL (ref 30.0–36.0)
MCV: 88.5 fl (ref 78.0–100.0)
MONOS PCT: 7.5 % (ref 3.0–12.0)
Monocytes Absolute: 0.7 10*3/uL (ref 0.1–1.0)
Neutro Abs: 4.9 10*3/uL (ref 1.4–7.7)
Neutrophils Relative %: 52.4 % (ref 43.0–77.0)
Platelets: 213 10*3/uL (ref 150.0–400.0)
RBC: 5.71 Mil/uL (ref 4.22–5.81)
RDW: 13.6 % (ref 11.5–15.5)
WBC: 9.3 10*3/uL (ref 4.0–10.5)

## 2018-02-12 LAB — HEPATIC FUNCTION PANEL
ALBUMIN: 4.6 g/dL (ref 3.5–5.2)
ALK PHOS: 84 U/L (ref 39–117)
ALT: 33 U/L (ref 0–53)
AST: 22 U/L (ref 0–37)
Bilirubin, Direct: 0.1 mg/dL (ref 0.0–0.3)
TOTAL PROTEIN: 7.8 g/dL (ref 6.0–8.3)
Total Bilirubin: 0.6 mg/dL (ref 0.2–1.2)

## 2018-02-12 LAB — POC URINALSYSI DIPSTICK (AUTOMATED)
Bilirubin, UA: NEGATIVE
Glucose, UA: NEGATIVE
Ketones, UA: NEGATIVE
Leukocytes, UA: NEGATIVE
NITRITE UA: NEGATIVE
Protein, UA: POSITIVE — AB
Spec Grav, UA: >=1.03 — AB (ref 1.010–1.025)
Urobilinogen, UA: 0.2 E.U./dL
pH, UA: 6 (ref 5.0–8.0)

## 2018-02-12 LAB — LIPID PANEL
CHOL/HDL RATIO: 5
Cholesterol: 162 mg/dL (ref 0–200)
HDL: 31.2 mg/dL — AB (ref >39.00)
LDL Cholesterol: 105 mg/dL — ABNORMAL HIGH (ref 0–99)
NonHDL: 130.5
Triglycerides: 128 mg/dL (ref 0.0–149.0)
VLDL: 25.6 mg/dL (ref 0.0–40.0)

## 2018-02-12 LAB — PSA: PSA: 4.07 ng/mL — ABNORMAL HIGH (ref 0.10–4.00)

## 2018-02-12 MED ORDER — LANSOPRAZOLE 15 MG PO CPDR: DELAYED_RELEASE_CAPSULE | ORAL | 3 refills | Status: DC

## 2018-02-12 MED ORDER — AMLODIPINE BESYLATE 10 MG PO TABS: 10.0000 mg | ORAL_TABLET | Freq: Every day | ORAL | 3 refills | Status: DC

## 2018-02-12 NOTE — Progress Notes (Signed)
   Subjective:    Patient ID: Guy [place], male    DOB: 04-08-1965, 52 y.o.   MRN: [id number]  HPI Here for a well exam. He feels fine in general. His BP at home has been stable.    Review of Systems  Constitutional: Negative.   HENT: Negative.   Eyes: Negative.   Respiratory: Negative.   Cardiovascular: Negative.   Gastrointestinal: Negative.   Genitourinary: Negative.   Musculoskeletal: Negative.   Skin: Negative.   Neurological: Negative.   Psychiatric/Behavioral: Negative.        Objective:   Physical Exam Constitutional:      General: He is not in acute distress.    Appearance: He is well-developed. He is not diaphoretic.  HENT:     Head: Normocephalic and atraumatic.     Right Ear: External ear normal.     Left Ear: External ear normal.     Nose: Nose normal.     Mouth/Throat:     Pharynx: No oropharyngeal exudate.  Eyes:     General: No scleral icterus.       Right eye: No discharge.        Left eye: No discharge.     Conjunctiva/sclera: Conjunctivae normal.     Pupils: Pupils are equal, round, and reactive to light.  Neck:     Musculoskeletal: Neck supple.     Thyroid: No thyromegaly.     Vascular: No JVD.     Trachea: No tracheal deviation.  Cardiovascular:     Rate and Rhythm: Normal rate and regular rhythm.     Heart sounds: Normal heart sounds. No murmur. No friction rub. No gallop.      Comments: EKG is normal  Pulmonary:     Effort: Pulmonary effort is normal. No respiratory distress.     Breath sounds: Normal breath sounds. No wheezing or rales.  Chest:     Chest wall: No tenderness.  Abdominal:     General: Bowel sounds are normal. There is no distension.     Palpations: Abdomen is soft. There is no mass.     Tenderness: There is no abdominal tenderness. There is no guarding or rebound.  Genitourinary:    Penis: Normal. No tenderness.      Prostate: Normal.     Rectum: Normal. Guaiac result negative.  Musculoskeletal: Normal range of  motion.        General: No tenderness.  Lymphadenopathy:     Cervical: No cervical adenopathy.  Skin:    General: Skin is warm and dry.     Coloration: Skin is not pale.     Findings: No erythema or rash.  Neurological:     Mental Status: He is alert and oriented to person, place, and time.     Cranial Nerves: No cranial nerve deficit.     Motor: No abnormal muscle tone.     Coordination: Coordination normal.     Deep Tendon Reflexes: Reflexes are normal and symmetric. Reflexes normal.  Psychiatric:        Behavior: Behavior normal.        Thought Content: Thought content normal.        Judgment: Judgment normal.           Assessment & Plan:  Well exam. We discussed diet and exercise. Get fasting labs. We will increase the Amlodipine to 10 mg daily.  Alysia Penna, MD

## 2018-02-17 ENCOUNTER — Encounter: Payer: Self-pay | Admitting: *Deleted

## 2018-03-13 DIAGNOSIS — H40023 Open angle with borderline findings, high risk, bilateral: Secondary | ICD-10-CM | POA: Diagnosis not present

## 2018-04-11 DIAGNOSIS — H40023 Open angle with borderline findings, high risk, bilateral: Secondary | ICD-10-CM | POA: Diagnosis not present

## 2018-08-28 DIAGNOSIS — M9901 Segmental and somatic dysfunction of cervical region: Secondary | ICD-10-CM | POA: Diagnosis not present

## 2018-08-28 DIAGNOSIS — M9903 Segmental and somatic dysfunction of lumbar region: Secondary | ICD-10-CM | POA: Diagnosis not present

## 2018-08-28 DIAGNOSIS — M9905 Segmental and somatic dysfunction of pelvic region: Secondary | ICD-10-CM | POA: Diagnosis not present

## 2018-08-28 DIAGNOSIS — M9902 Segmental and somatic dysfunction of thoracic region: Secondary | ICD-10-CM | POA: Diagnosis not present

## 2018-09-04 DIAGNOSIS — M9905 Segmental and somatic dysfunction of pelvic region: Secondary | ICD-10-CM | POA: Diagnosis not present

## 2018-09-04 DIAGNOSIS — M9903 Segmental and somatic dysfunction of lumbar region: Secondary | ICD-10-CM | POA: Diagnosis not present

## 2018-09-04 DIAGNOSIS — M9901 Segmental and somatic dysfunction of cervical region: Secondary | ICD-10-CM | POA: Diagnosis not present

## 2018-09-04 DIAGNOSIS — M9902 Segmental and somatic dysfunction of thoracic region: Secondary | ICD-10-CM | POA: Diagnosis not present

## 2018-09-12 DIAGNOSIS — M9901 Segmental and somatic dysfunction of cervical region: Secondary | ICD-10-CM | POA: Diagnosis not present

## 2018-09-12 DIAGNOSIS — M9905 Segmental and somatic dysfunction of pelvic region: Secondary | ICD-10-CM | POA: Diagnosis not present

## 2018-09-12 DIAGNOSIS — M9902 Segmental and somatic dysfunction of thoracic region: Secondary | ICD-10-CM | POA: Diagnosis not present

## 2018-09-12 DIAGNOSIS — M9903 Segmental and somatic dysfunction of lumbar region: Secondary | ICD-10-CM | POA: Diagnosis not present

## 2018-12-08 ENCOUNTER — Other Ambulatory Visit: Payer: Self-pay

## 2018-12-08 ENCOUNTER — Ambulatory Visit (INDEPENDENT_AMBULATORY_CARE_PROVIDER_SITE_OTHER): Payer: BC Managed Care – PPO

## 2018-12-08 DIAGNOSIS — Z23 Encounter for immunization: Secondary | ICD-10-CM

## 2018-12-14 IMAGING — DX DG ELBOW COMPLETE 3+V*R*
4 series · 4 of 4 positions shown · non-contrast
Comparison: None in PACs

CLINICAL DATA: Palpable nodule over the posterolateral aspect of
the elbow. No recent injury. Patient does reported fracture the
elbow as a child.

EXAM:
RIGHT ELBOW - COMPLETE 3+ VIEW

[elbow ap]
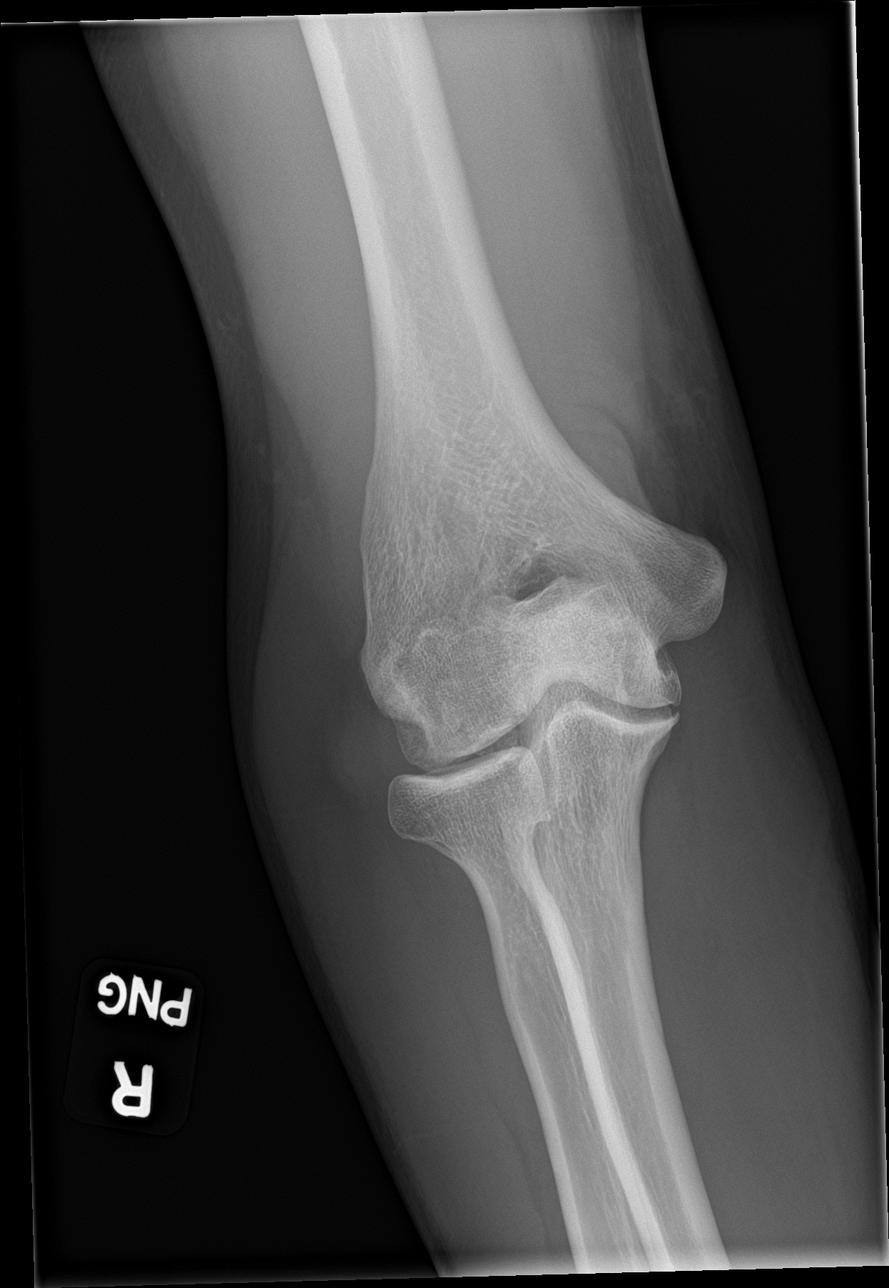

[elbow obl (1 of 2)]
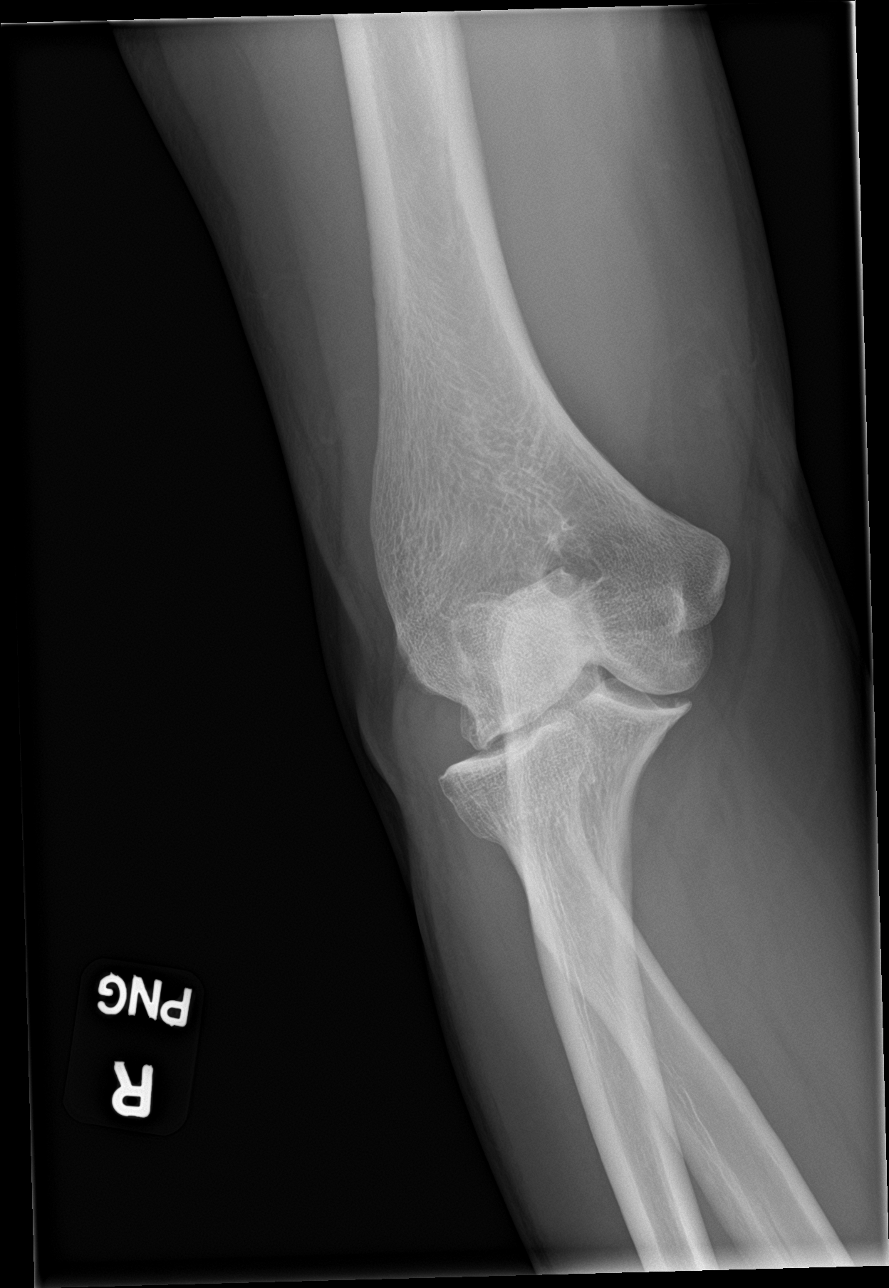

[elbow obl (2 of 2)]
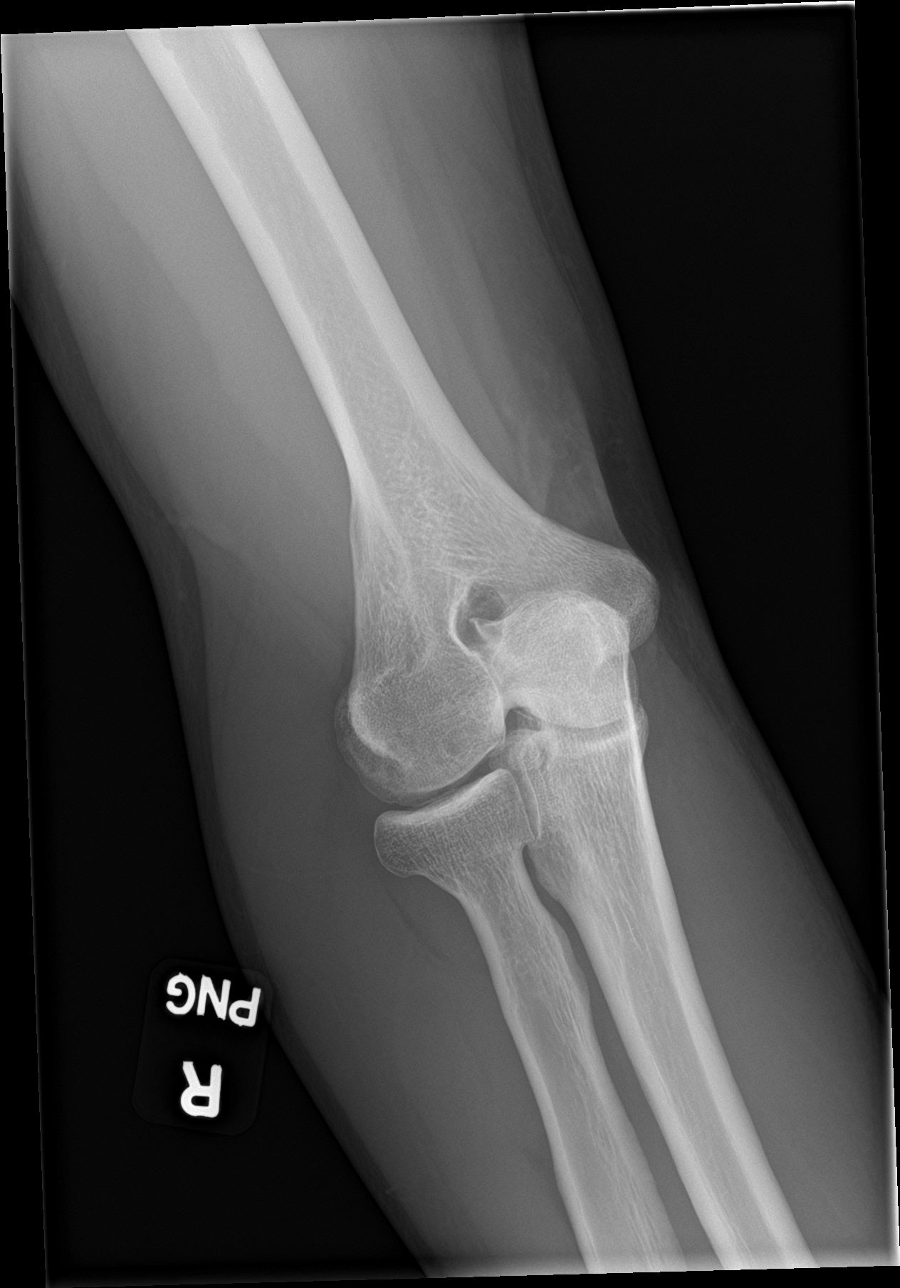

[elbow lat]
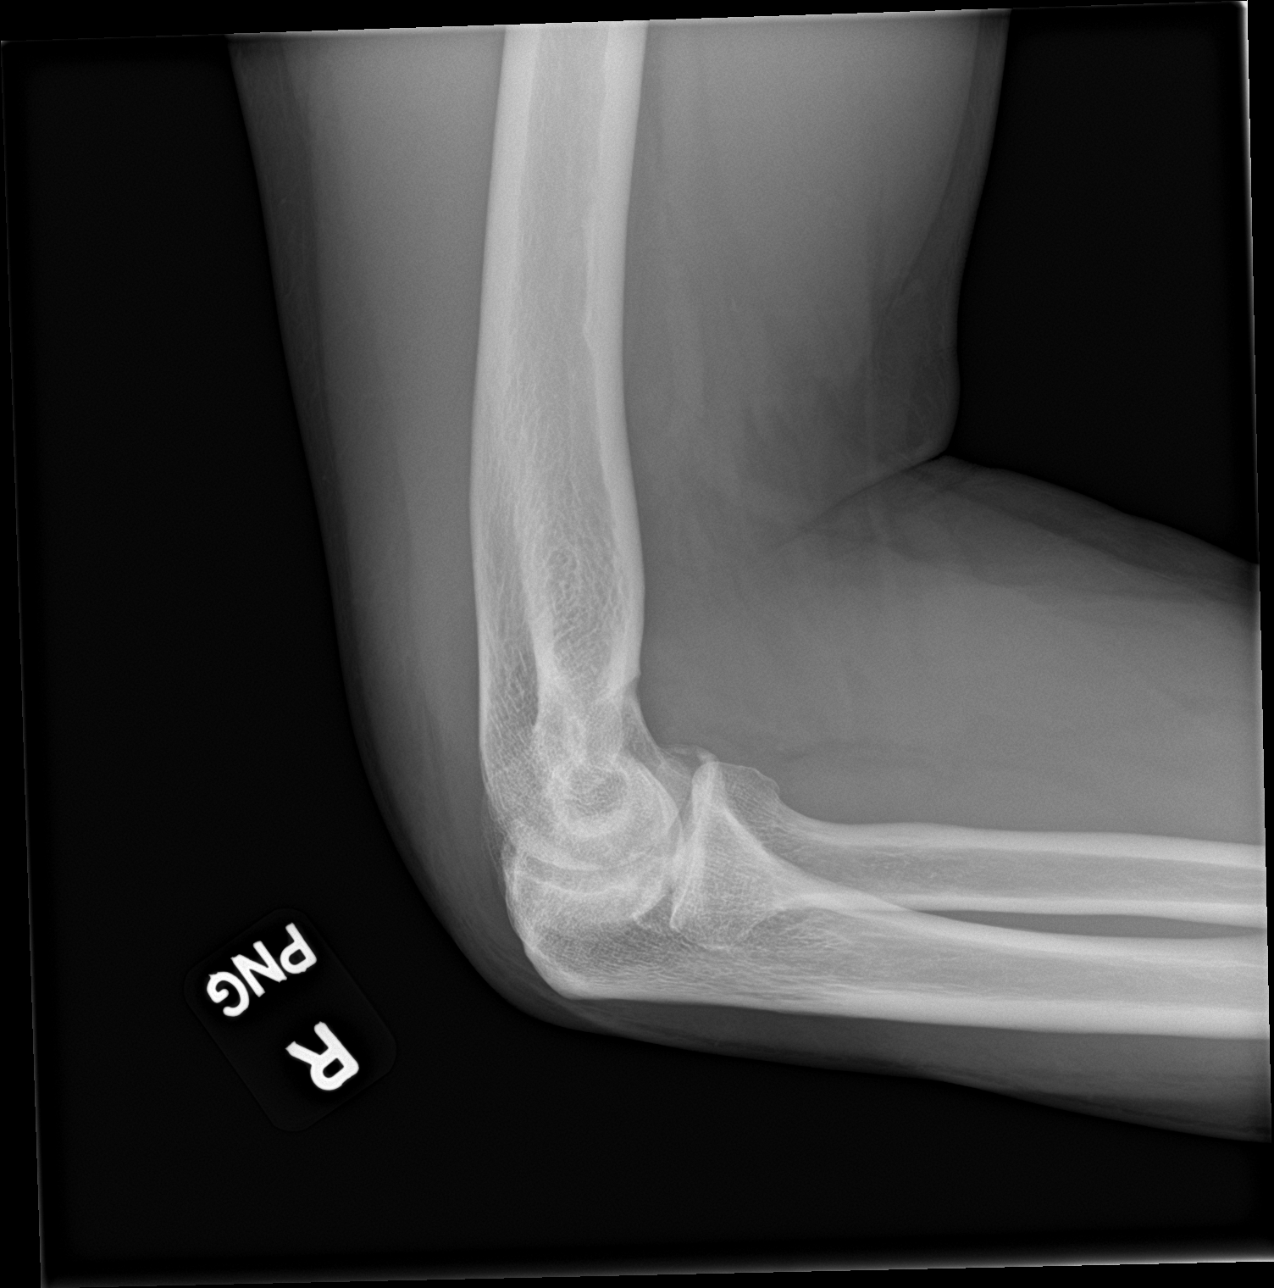

[4 of 4 positions shown; findings below may reference images not displayed]

FINDINGS: The bones are subjectively adequately mineralized. There is
calcification adjacent to the coronoid process of the olecranon. The
radial head is intact. There is minimal spurring of the extensor
aspect of the olecranon. There is no joint effusion. The distal
humerus is intact but there is degenerative change of the articular
surface of the lateral condyles.
IMPRESSION: Mild degenerative changes centered on the coronoid process of the a
ulna. Mild degenerative spurring of the lateral condylar surface of
the distal humerus. No acute bony abnormality.

No abnormal soft tissue nodule.

## 2019-02-17 ENCOUNTER — Other Ambulatory Visit: Payer: Self-pay

## 2019-02-17 ENCOUNTER — Encounter: Payer: Self-pay | Admitting: Family Medicine

## 2019-02-17 ENCOUNTER — Ambulatory Visit (INDEPENDENT_AMBULATORY_CARE_PROVIDER_SITE_OTHER): Payer: BC Managed Care – PPO | Admitting: Family Medicine

## 2019-02-17 VITALS — BP 140/80 | HR 73 | Temp 97.6°F | Wt 235.6 lb

## 2019-02-17 DIAGNOSIS — Z125 Encounter for screening for malignant neoplasm of prostate: Secondary | ICD-10-CM

## 2019-02-17 DIAGNOSIS — Z0000 Encounter for general adult medical examination without abnormal findings: Secondary | ICD-10-CM

## 2019-02-17 LAB — CBC WITH DIFFERENTIAL/PLATELET
Basophils Absolute: 0 10*3/uL (ref 0.0–0.1)
Basophils Relative: 0.4 % (ref 0.0–3.0)
Eosinophils Absolute: 0.4 10*3/uL (ref 0.0–0.7)
Eosinophils Relative: 3.9 % (ref 0.0–5.0)
HCT: 47.9 % (ref 39.0–52.0)
Hemoglobin: 16.2 g/dL (ref 13.0–17.0)
Lymphocytes Relative: 31.4 % (ref 12.0–46.0)
Lymphs Abs: 3.4 10*3/uL (ref 0.7–4.0)
MCHC: 33.7 g/dL (ref 30.0–36.0)
MCV: 88.4 fl (ref 78.0–100.0)
Monocytes Absolute: 0.8 10*3/uL (ref 0.1–1.0)
Monocytes Relative: 7.4 % (ref 3.0–12.0)
Neutro Abs: 6.1 10*3/uL (ref 1.4–7.7)
Neutrophils Relative %: 56.9 % (ref 43.0–77.0)
Platelets: 215 10*3/uL (ref 150.0–400.0)
RBC: 5.42 Mil/uL (ref 4.22–5.81)
RDW: 13.3 % (ref 11.5–15.5)
WBC: 10.7 10*3/uL — ABNORMAL HIGH (ref 4.0–10.5)

## 2019-02-17 LAB — LIPID PANEL
Cholesterol: 153 mg/dL (ref 0–200)
HDL: 30.9 mg/dL — ABNORMAL LOW (ref >39.00)
LDL Cholesterol: 96 mg/dL (ref 0–99)
NonHDL: 121.85
Total CHOL/HDL Ratio: 5
Triglycerides: 130 mg/dL (ref 0.0–149.0)
VLDL: 26 mg/dL (ref 0.0–40.0)

## 2019-02-17 LAB — HEPATIC FUNCTION PANEL
ALT: 31 U/L (ref 0–53)
AST: 26 U/L (ref 0–37)
Albumin: 4.5 g/dL (ref 3.5–5.2)
Alkaline Phosphatase: 79 U/L (ref 39–117)
Bilirubin, Direct: 0.1 mg/dL (ref 0.0–0.3)
Total Bilirubin: 0.6 mg/dL (ref 0.2–1.2)
Total Protein: 7.3 g/dL (ref 6.0–8.3)

## 2019-02-17 LAB — BASIC METABOLIC PANEL
BUN: 13 mg/dL (ref 6–23)
CO2: 28 mEq/L (ref 19–32)
Calcium: 9.6 mg/dL (ref 8.4–10.5)
Chloride: 103 mEq/L (ref 96–112)
Creatinine, Ser: 0.91 mg/dL (ref 0.40–1.50)
GFR: 104.8 mL/min (ref >60.00)
Glucose, Bld: 91 mg/dL (ref 70–99)
Potassium: 4 mEq/L (ref 3.5–5.1)
Sodium: 138 mEq/L (ref 135–145)

## 2019-02-17 LAB — TSH: TSH: 0.6 u[IU]/mL (ref 0.35–4.50)

## 2019-02-17 LAB — PSA: PSA: 3.77 ng/mL (ref 0.10–4.00)

## 2019-02-17 MED ORDER — LANSOPRAZOLE 15 MG PO CPDR: DELAYED_RELEASE_CAPSULE | ORAL | 3 refills | Status: DC

## 2019-02-17 MED ORDER — AMLODIPINE BESYLATE 10 MG PO TABS: 10.0000 mg | ORAL_TABLET | Freq: Every day | ORAL | 3 refills | Status: DC

## 2019-02-17 NOTE — Progress Notes (Signed)
   Subjective:    Patient ID: Guy [place], male    DOB: Aug 30, 1965, 53 y.o.   MRN: [id number]  HPI Here for a well exam. He feels great. His BP at home is stable.    Review of Systems  Constitutional: Negative.   HENT: Negative.   Eyes: Negative.   Respiratory: Negative.   Cardiovascular: Negative.   Gastrointestinal: Negative.   Genitourinary: Negative.   Musculoskeletal: Negative.   Skin: Negative.   Neurological: Negative.   Psychiatric/Behavioral: Negative.        Objective:   Physical Exam Constitutional:      General: He is not in acute distress.    Appearance: He is well-developed. He is not diaphoretic.  HENT:     Head: Normocephalic and atraumatic.     Right Ear: External ear normal.     Left Ear: External ear normal.     Nose: Nose normal.     Mouth/Throat:     Pharynx: No oropharyngeal exudate.  Eyes:     General: No scleral icterus.       Right eye: No discharge.        Left eye: No discharge.     Conjunctiva/sclera: Conjunctivae normal.     Pupils: Pupils are equal, round, and reactive to light.  Neck:     Thyroid: No thyromegaly.     Vascular: No JVD.     Trachea: No tracheal deviation.  Cardiovascular:     Rate and Rhythm: Normal rate and regular rhythm.     Heart sounds: Normal heart sounds. No murmur. No friction rub. No gallop.   Pulmonary:     Effort: Pulmonary effort is normal. No respiratory distress.     Breath sounds: Normal breath sounds. No wheezing or rales.  Chest:     Chest wall: No tenderness.  Abdominal:     General: Bowel sounds are normal. There is no distension.     Palpations: Abdomen is soft. There is no mass.     Tenderness: There is no abdominal tenderness. There is no guarding or rebound.  Genitourinary:    Penis: Normal. No tenderness.      Testes: Normal.     Prostate: Normal.     Rectum: Normal. Guaiac result negative.  Musculoskeletal:        General: No tenderness. Normal range of motion.     Cervical back:  Neck supple.  Lymphadenopathy:     Cervical: No cervical adenopathy.  Skin:    General: Skin is warm and dry.     Coloration: Skin is not pale.     Findings: No erythema or rash.  Neurological:     Mental Status: He is alert and oriented to person, place, and time.     Cranial Nerves: No cranial nerve deficit.     Motor: No abnormal muscle tone.     Coordination: Coordination normal.     Deep Tendon Reflexes: Reflexes are normal and symmetric. Reflexes normal.  Psychiatric:        Behavior: Behavior normal.        Thought Content: Thought content normal.        Judgment: Judgment normal.           Assessment & Plan:  Well exam. We discussed diet and exercise. Get fasting labs. Alysia Penna, MD

## 2019-03-19 DIAGNOSIS — H40023 Open angle with borderline findings, high risk, bilateral: Secondary | ICD-10-CM | POA: Diagnosis not present

## 2019-12-15 ENCOUNTER — Encounter: Payer: Self-pay | Admitting: Family Medicine

## 2019-12-15 ENCOUNTER — Ambulatory Visit (INDEPENDENT_AMBULATORY_CARE_PROVIDER_SITE_OTHER): Payer: BC Managed Care – PPO

## 2019-12-15 ENCOUNTER — Other Ambulatory Visit: Payer: Self-pay

## 2019-12-15 DIAGNOSIS — Z23 Encounter for immunization: Secondary | ICD-10-CM | POA: Diagnosis not present

## 2020-02-04 ENCOUNTER — Encounter: Payer: Self-pay | Admitting: Family Medicine

## 2020-02-04 ENCOUNTER — Telehealth (INDEPENDENT_AMBULATORY_CARE_PROVIDER_SITE_OTHER): Payer: BC Managed Care – PPO | Admitting: Family Medicine

## 2020-02-04 ENCOUNTER — Telehealth: Payer: Self-pay | Admitting: Family Medicine

## 2020-02-04 DIAGNOSIS — J019 Acute sinusitis, unspecified: Secondary | ICD-10-CM

## 2020-02-04 MED ORDER — HYDROCODONE-HOMATROPINE 5-1.5 MG/5ML PO SYRP: 5.0000 mL | ORAL_SOLUTION | ORAL | 0 refills | Status: DC | PRN

## 2020-02-04 MED ORDER — AZITHROMYCIN 250 MG PO TABS: ORAL_TABLET | ORAL | 0 refills | Status: DC

## 2020-02-04 NOTE — Telephone Encounter (Signed)
The patient was seen by Dr fry today [facility]) visit  was prescribe  hydrocodone-homatropine (HYCODAN) 5-1.5 ([id number] - [place], [place] [address]  The patient Pharmacy does not have this  cough syrup   pt would like the HYDROcodone-homatropine (HYCODAN) 5-1.5 MG/5ML syrup called into [facility] [address] Phone: [phone] ,  [place], [place] [id number] instead.

## 2020-02-04 NOTE — Telephone Encounter (Signed)
Pt is calling in stating that he would like to have this Rx hydrocodone-homatropine [facility]) sent to [facility] on [address] and [employer]

## 2020-02-04 NOTE — Progress Notes (Signed)
Subjective:    Patient ID: Guy [place], male    DOB: 1965/12/23, 54 y.o.   MRN: [id number]  HPI Virtual Visit via Video Note  I connected with the patient on 02/04/20 at  8:30 AM EST by a video enabled telemedicine application and verified that I am speaking with the correct person using two identifiers.  Location patient: home Location provider:work or home office Persons participating in the virtual visit: patient, provider  I discussed the limitations of evaluation and management by telemedicine and the availability of in person appointments. The patient expressed understanding and agreed to proceed.   HPI: Here for 6 days of sinus pressure, PND, ST, and a dry cough. No SOB or fever. No body aches or NVD. He tested negative for the Covid virus 3 days ago. He is using Mucinex and Theraflu.   ROS: See pertinent positives and negatives per HPI.  Past Medical History:  Diagnosis Date  . Alcohol abuse   . Allergy   . GERD (gastroesophageal reflux disease)   . Hypertension     Past Surgical History:  Procedure Laterality Date  . COLONOSCOPY  04/11/2015   per Dr. Fuller Plan, benign polyps, repeat in 10 yrs   . NO PAST SURGERIES      Family History  Problem Relation Age of Onset  . Diverticulitis Father   . Alcohol abuse Other        fhx  . Arthritis Other        fhx  . Diabetes Other        fhx  . Hypertension Other        fhx  . Stroke Other        fhx  . Diverticulitis Sister   . Colon cancer Neg Hx   . Esophageal cancer Neg Hx   . Rectal cancer Neg Hx   . Stomach cancer Neg Hx   . Prostate cancer Neg Hx      Current Outpatient Medications:  .  amLODipine (NORVASC) 10 MG tablet, Take 1 tablet (10 mg total) by mouth daily., Disp: 90 tablet, Rfl: 3 .  fexofenadine (ALLEGRA) 180 MG tablet, Take 180 mg by mouth daily as needed., Disp: , Rfl:  .  lansoprazole (PREVACID) 15 MG capsule, Take 1 capsule once daily, Disp: 90 capsule, Rfl: 3 .  azithromycin  (ZITHROMAX Z-PAK) 250 MG tablet, As directed, Disp: 6 each, Rfl: 0 .  HYDROcodone-homatropine (HYCODAN) 5-1.5 MG/5ML syrup, Take 5 mLs by mouth every 4 (four) hours as needed for cough., Disp: 240 mL, Rfl: 0  EXAM:  VITALS per patient if applicable:  GENERAL: alert, oriented, appears well and in no acute distress  HEENT: atraumatic, conjunttiva clear, no obvious abnormalities on inspection of external nose and ears  NECK: normal movements of the head and neck  LUNGS: on inspection no signs of respiratory distress, breathing rate appears normal, no obvious gross SOB, gasping or wheezing  CV: no obvious cyanosis  MS: moves all visible extremities without noticeable abnormality  PSYCH/NEURO: pleasant and cooperative, no obvious depression or anxiety, speech and thought processing grossly intact  ASSESSMENT AND PLAN: Sinusitis, treat with a Zpack. Alysia Penna, MD  Discussed the following assessment and plan:  No diagnosis found.     I discussed the assessment and treatment plan with the patient. The patient was provided an opportunity to ask questions and all were answered. The patient agreed with the plan and demonstrated an understanding of the instructions.   The patient was  advised to call back or seek an in-person evaluation if the symptoms worsen or if the condition fails to improve as anticipated.     Review of Systems     Objective:   Physical Exam        Assessment & Plan:

## 2020-02-05 MED ORDER — HYDROCODONE-HOMATROPINE 5-1.5 MG/5ML PO SYRP: 5.0000 mL | ORAL_SOLUTION | ORAL | 0 refills | Status: DC | PRN

## 2020-02-05 NOTE — Telephone Encounter (Signed)
Please advise.   Thanks. Dm/cma  

## 2020-02-05 NOTE — Telephone Encounter (Signed)
Pt is calling in to check the status and to see if it can be called in today. To the [facility] on [address]

## 2020-02-05 NOTE — Telephone Encounter (Signed)
HYDROcodone-homatropine (HYCODAN) 5-1.5 MG/5ML syrup  Send to [facility] on [address]. They have this medication in stock  Please contact Guy Houston on her cell to let her know when the Rx has been sent.

## 2020-02-05 NOTE — Telephone Encounter (Signed)
Done

## 2020-02-24 ENCOUNTER — Encounter: Payer: Self-pay | Admitting: Family Medicine

## 2020-02-24 ENCOUNTER — Other Ambulatory Visit: Payer: Self-pay

## 2020-02-24 ENCOUNTER — Ambulatory Visit (INDEPENDENT_AMBULATORY_CARE_PROVIDER_SITE_OTHER): Payer: BC Managed Care – PPO | Admitting: Family Medicine

## 2020-02-24 VITALS — BP 142/80 | HR 76 | Temp 98.3°F | Ht 70.5 in | Wt 237.0 lb

## 2020-02-24 DIAGNOSIS — Z0000 Encounter for general adult medical examination without abnormal findings: Secondary | ICD-10-CM

## 2020-02-24 LAB — CBC WITH DIFFERENTIAL/PLATELET
Basophils Absolute: 0.1 10*3/uL (ref 0.0–0.1)
Basophils Relative: 0.6 % (ref 0.0–3.0)
Eosinophils Absolute: 0.4 10*3/uL (ref 0.0–0.7)
Eosinophils Relative: 4.2 % (ref 0.0–5.0)
HCT: 48.3 % (ref 39.0–52.0)
Hemoglobin: 16.5 g/dL (ref 13.0–17.0)
Lymphocytes Relative: 31.7 % (ref 12.0–46.0)
Lymphs Abs: 3.1 10*3/uL (ref 0.7–4.0)
MCHC: 34 g/dL (ref 30.0–36.0)
MCV: 88.6 fl (ref 78.0–100.0)
Monocytes Absolute: 0.7 10*3/uL (ref 0.1–1.0)
Monocytes Relative: 7.3 % (ref 3.0–12.0)
Neutro Abs: 5.4 10*3/uL (ref 1.4–7.7)
Neutrophils Relative %: 56.2 % (ref 43.0–77.0)
Platelets: 208 10*3/uL (ref 150.0–400.0)
RBC: 5.45 Mil/uL (ref 4.22–5.81)
RDW: 13.7 % (ref 11.5–15.5)
WBC: 9.6 10*3/uL (ref 4.0–10.5)

## 2020-02-24 LAB — HEPATIC FUNCTION PANEL
ALT: 38 U/L (ref 0–53)
AST: 28 U/L (ref 0–37)
Albumin: 4.8 g/dL (ref 3.5–5.2)
Alkaline Phosphatase: 73 U/L (ref 39–117)
Bilirubin, Direct: 0.1 mg/dL (ref 0.0–0.3)
Total Bilirubin: 0.6 mg/dL (ref 0.2–1.2)
Total Protein: 7.7 g/dL (ref 6.0–8.3)

## 2020-02-24 LAB — LIPID PANEL
Cholesterol: 146 mg/dL (ref 0–200)
HDL: 29.9 mg/dL — ABNORMAL LOW (ref >39.00)
LDL Cholesterol: 89 mg/dL (ref 0–99)
NonHDL: 115.88
Total CHOL/HDL Ratio: 5
Triglycerides: 136 mg/dL (ref 0.0–149.0)
VLDL: 27.2 mg/dL (ref 0.0–40.0)

## 2020-02-24 LAB — BASIC METABOLIC PANEL
BUN: 9 mg/dL (ref 6–23)
CO2: 29 mEq/L (ref 19–32)
Calcium: 9.5 mg/dL (ref 8.4–10.5)
Chloride: 102 mEq/L (ref 96–112)
Creatinine, Ser: 0.96 mg/dL (ref 0.40–1.50)
GFR: 88.88 mL/min (ref >60.00)
Glucose, Bld: 87 mg/dL (ref 70–99)
Potassium: 4.4 mEq/L (ref 3.5–5.1)
Sodium: 138 mEq/L (ref 135–145)

## 2020-02-24 LAB — PSA: PSA: 2.91 ng/mL (ref 0.10–4.00)

## 2020-02-24 LAB — TSH: TSH: 0.89 u[IU]/mL (ref 0.35–4.50)

## 2020-02-24 MED ORDER — AMLODIPINE BESYLATE 10 MG PO TABS: 10.0000 mg | ORAL_TABLET | Freq: Every day | ORAL | 3 refills | Status: DC

## 2020-02-24 MED ORDER — LANSOPRAZOLE 15 MG PO CPDR: DELAYED_RELEASE_CAPSULE | ORAL | 3 refills | Status: DC

## 2020-02-24 NOTE — Addendum Note (Signed)
Addended by: Evert Kohl D on: 02/24/2020 11:09 AM   Modules accepted: Orders

## 2020-02-24 NOTE — Addendum Note (Signed)
Addended by: Evert Kohl D on: 02/24/2020 11:08 AM   Modules accepted: Orders

## 2020-02-24 NOTE — Progress Notes (Signed)
Subjective:    Patient ID: SHERLOCK NANCARROW, male    DOB: June 22, 1965, 54 y.o.   MRN: [id number]  HPI Here for a well exam. He feels well.    Review of Systems  Constitutional: Negative.   HENT: Negative.   Eyes: Negative.   Respiratory: Negative.   Cardiovascular: Negative.   Gastrointestinal: Negative.   Genitourinary: Negative.   Musculoskeletal: Negative.   Skin: Negative.   Neurological: Negative.   Psychiatric/Behavioral: Negative.        Objective:   Physical Exam Constitutional:      General: He is not in acute distress.    Appearance: He is well-developed and well-nourished. He is not diaphoretic.  HENT:     Head: Normocephalic and atraumatic.     Right Ear: External ear normal.     Left Ear: External ear normal.     Nose: Nose normal.     Mouth/Throat:     Mouth: Oropharynx is clear and moist.     Pharynx: No oropharyngeal exudate.  Eyes:     General: No scleral icterus.       Right eye: No discharge.        Left eye: No discharge.     Extraocular Movements: EOM normal.     Conjunctiva/sclera: Conjunctivae normal.     Pupils: Pupils are equal, round, and reactive to light.  Neck:     Thyroid: No thyromegaly.     Vascular: No JVD.     Trachea: No tracheal deviation.  Cardiovascular:     Rate and Rhythm: Normal rate and regular rhythm.     Pulses: Intact distal pulses.     Heart sounds: Normal heart sounds. No murmur heard. No friction rub. No gallop.   Pulmonary:     Effort: Pulmonary effort is normal. No respiratory distress.     Breath sounds: Normal breath sounds. No wheezing or rales.  Chest:     Chest wall: No tenderness.  Abdominal:     General: Bowel sounds are normal. There is no distension.     Palpations: Abdomen is soft. There is no mass.     Tenderness: There is no abdominal tenderness. There is no guarding or rebound.  Genitourinary:    Penis: Normal. No tenderness.      Testes: Normal.     Prostate: Normal.     Rectum: Normal.  Guaiac result negative.  Musculoskeletal:        General: No tenderness or edema. Normal range of motion.     Cervical back: Neck supple.  Lymphadenopathy:     Cervical: No cervical adenopathy.  Skin:    General: Skin is warm and dry.     Coloration: Skin is not pale.     Findings: No erythema or rash.  Neurological:     Mental Status: He is alert and oriented to person, place, and time.     Cranial Nerves: No cranial nerve deficit.     Motor: No abnormal muscle tone.     Coordination: Coordination normal.     Deep Tendon Reflexes: Reflexes are normal and symmetric. Reflexes normal.  Psychiatric:        Mood and Affect: Mood and affect normal.        Behavior: Behavior normal.        Thought Content: Thought content normal.        Judgment: Judgment normal.           Assessment & Plan:  Well exam. We  discussed diet and exercise. Get fasting labs. Alysia Penna, MD

## 2020-12-05 ENCOUNTER — Ambulatory Visit: Payer: BC Managed Care – PPO

## 2020-12-12 ENCOUNTER — Ambulatory Visit (INDEPENDENT_AMBULATORY_CARE_PROVIDER_SITE_OTHER): Payer: BC Managed Care – PPO

## 2020-12-12 ENCOUNTER — Other Ambulatory Visit: Payer: Self-pay

## 2020-12-12 DIAGNOSIS — Z23 Encounter for immunization: Secondary | ICD-10-CM | POA: Diagnosis not present

## 2021-03-08 ENCOUNTER — Ambulatory Visit (INDEPENDENT_AMBULATORY_CARE_PROVIDER_SITE_OTHER): Payer: BC Managed Care – PPO | Admitting: Family Medicine

## 2021-03-08 ENCOUNTER — Encounter: Payer: Self-pay | Admitting: Family Medicine

## 2021-03-08 VITALS — BP 138/80 | HR 70 | Temp 98.8°F | Ht 71.0 in | Wt 240.0 lb

## 2021-03-08 DIAGNOSIS — Z23 Encounter for immunization: Secondary | ICD-10-CM | POA: Diagnosis not present

## 2021-03-08 DIAGNOSIS — Z0000 Encounter for general adult medical examination without abnormal findings: Secondary | ICD-10-CM | POA: Diagnosis not present

## 2021-03-08 LAB — BASIC METABOLIC PANEL
BUN: 12 mg/dL (ref 6–23)
CO2: 30 mEq/L (ref 19–32)
Calcium: 9.7 mg/dL (ref 8.4–10.5)
Chloride: 102 mEq/L (ref 96–112)
Creatinine, Ser: 1.02 mg/dL (ref 0.40–1.50)
GFR: 82.05 mL/min (ref >60.00)
Glucose, Bld: 96 mg/dL (ref 70–99)
Potassium: 4.3 mEq/L (ref 3.5–5.1)
Sodium: 138 mEq/L (ref 135–145)

## 2021-03-08 LAB — CBC WITH DIFFERENTIAL/PLATELET
Basophils Absolute: 0.1 10*3/uL (ref 0.0–0.1)
Basophils Relative: 0.7 % (ref 0.0–3.0)
Eosinophils Absolute: 0.5 10*3/uL (ref 0.0–0.7)
Eosinophils Relative: 5.4 % — ABNORMAL HIGH (ref 0.0–5.0)
HCT: 51.2 % (ref 39.0–52.0)
Hemoglobin: 17 g/dL (ref 13.0–17.0)
Lymphocytes Relative: 35.2 % (ref 12.0–46.0)
Lymphs Abs: 3.4 10*3/uL (ref 0.7–4.0)
MCHC: 33.2 g/dL (ref 30.0–36.0)
MCV: 88.3 fl (ref 78.0–100.0)
Monocytes Absolute: 0.7 10*3/uL (ref 0.1–1.0)
Monocytes Relative: 7.1 % (ref 3.0–12.0)
Neutro Abs: 4.9 10*3/uL (ref 1.4–7.7)
Neutrophils Relative %: 51.6 % (ref 43.0–77.0)
Platelets: 209 10*3/uL (ref 150.0–400.0)
RBC: 5.79 Mil/uL (ref 4.22–5.81)
RDW: 13.7 % (ref 11.5–15.5)
WBC: 9.5 10*3/uL (ref 4.0–10.5)

## 2021-03-08 LAB — HEPATIC FUNCTION PANEL
ALT: 31 U/L (ref 0–53)
AST: 27 U/L (ref 0–37)
Albumin: 4.7 g/dL (ref 3.5–5.2)
Alkaline Phosphatase: 81 U/L (ref 39–117)
Bilirubin, Direct: 0.1 mg/dL (ref 0.0–0.3)
Total Bilirubin: 0.6 mg/dL (ref 0.2–1.2)
Total Protein: 7.8 g/dL (ref 6.0–8.3)

## 2021-03-08 LAB — PSA: PSA: 4.3 ng/mL — ABNORMAL HIGH (ref 0.10–4.00)

## 2021-03-08 LAB — LIPID PANEL
Cholesterol: 155 mg/dL (ref 0–200)
HDL: 30 mg/dL — ABNORMAL LOW (ref >39.00)
LDL Cholesterol: 95 mg/dL (ref 0–99)
NonHDL: 124.54
Total CHOL/HDL Ratio: 5
Triglycerides: 150 mg/dL — ABNORMAL HIGH (ref 0.0–149.0)
VLDL: 30 mg/dL (ref 0.0–40.0)

## 2021-03-08 LAB — HEMOGLOBIN A1C: Hgb A1c MFr Bld: 5.8 % (ref 4.6–6.5)

## 2021-03-08 LAB — TSH: TSH: 1.29 u[IU]/mL (ref 0.35–5.50)

## 2021-03-08 MED ORDER — AMLODIPINE BESYLATE 10 MG PO TABS: 10.0000 mg | ORAL_TABLET | Freq: Every day | ORAL | 3 refills | Status: DC

## 2021-03-08 MED ORDER — LANSOPRAZOLE 15 MG PO CPDR: DELAYED_RELEASE_CAPSULE | ORAL | 3 refills | Status: DC

## 2021-03-08 NOTE — Progress Notes (Signed)
` ° °  Subjective:    Patient ID: Guy Houston, male    DOB: 11/18/1965, 56 y.o.   MRN: [id number]  HPI Here for a well exam. He feels fine.    Review of Systems  Constitutional: Negative.   HENT: Negative.    Eyes: Negative.   Respiratory: Negative.    Cardiovascular: Negative.   Gastrointestinal: Negative.   Genitourinary: Negative.   Musculoskeletal: Negative.   Skin: Negative.   Neurological: Negative.   Psychiatric/Behavioral: Negative.        Objective:   Physical Exam Constitutional:      General: He is not in acute distress.    Appearance: Normal appearance. He is well-developed. He is not diaphoretic.  HENT:     Head: Normocephalic and atraumatic.     Right Ear: External ear normal.     Left Ear: External ear normal.     Nose: Nose normal.     Mouth/Throat:     Pharynx: No oropharyngeal exudate.  Eyes:     General: No scleral icterus.       Right eye: No discharge.        Left eye: No discharge.     Conjunctiva/sclera: Conjunctivae normal.     Pupils: Pupils are equal, round, and reactive to light.  Neck:     Thyroid: No thyromegaly.     Vascular: No JVD.     Trachea: No tracheal deviation.  Cardiovascular:     Rate and Rhythm: Normal rate and regular rhythm.     Heart sounds: Normal heart sounds. No murmur heard.   No friction rub. No gallop.  Pulmonary:     Effort: Pulmonary effort is normal. No respiratory distress.     Breath sounds: Normal breath sounds. No wheezing or rales.  Chest:     Chest wall: No tenderness.  Abdominal:     General: Bowel sounds are normal. There is no distension.     Palpations: Abdomen is soft. There is no mass.     Tenderness: There is no abdominal tenderness. There is no guarding or rebound.  Genitourinary:    Penis: Normal. No tenderness.      Testes: Normal.     Prostate: Normal.     Rectum: Normal. Guaiac result negative.  Musculoskeletal:        General: No tenderness. Normal range of motion.     Cervical  back: Neck supple.  Lymphadenopathy:     Cervical: No cervical adenopathy.  Skin:    General: Skin is warm and dry.     Coloration: Skin is not pale.     Findings: No erythema or rash.  Neurological:     Mental Status: He is alert and oriented to person, place, and time.     Cranial Nerves: No cranial nerve deficit.     Motor: No abnormal muscle tone.     Coordination: Coordination normal.     Deep Tendon Reflexes: Reflexes are normal and symmetric. Reflexes normal.  Psychiatric:        Behavior: Behavior normal.        Thought Content: Thought content normal.        Judgment: Judgment normal.          Assessment & Plan:  Well exam. We discussed diet and exercise. Get fasting labs.  Alysia Penna, MD

## 2022-01-02 DIAGNOSIS — H40023 Open angle with borderline findings, high risk, bilateral: Secondary | ICD-10-CM | POA: Diagnosis not present

## 2022-03-28 ENCOUNTER — Encounter: Payer: Self-pay | Admitting: Family Medicine

## 2022-03-28 ENCOUNTER — Other Ambulatory Visit (INDEPENDENT_AMBULATORY_CARE_PROVIDER_SITE_OTHER): Payer: BC Managed Care – PPO

## 2022-03-28 ENCOUNTER — Ambulatory Visit (INDEPENDENT_AMBULATORY_CARE_PROVIDER_SITE_OTHER): Payer: BC Managed Care – PPO | Admitting: Family Medicine

## 2022-03-28 VITALS — BP 162/90 | HR 95 | Temp 98.1°F | Ht 71.0 in | Wt 242.0 lb

## 2022-03-28 DIAGNOSIS — R972 Elevated prostate specific antigen [PSA]: Secondary | ICD-10-CM | POA: Diagnosis not present

## 2022-03-28 DIAGNOSIS — Z0000 Encounter for general adult medical examination without abnormal findings: Secondary | ICD-10-CM

## 2022-03-28 LAB — CBC WITH DIFFERENTIAL/PLATELET
Basophils Absolute: 0.1 10*3/uL (ref 0.0–0.1)
Basophils Relative: 0.7 % (ref 0.0–3.0)
Eosinophils Absolute: 0.5 10*3/uL (ref 0.0–0.7)
Eosinophils Relative: 3.5 % (ref 0.0–5.0)
HCT: 49.5 % (ref 39.0–52.0)
Hemoglobin: 17.2 g/dL — ABNORMAL HIGH (ref 13.0–17.0)
Lymphocytes Relative: 23.8 % (ref 12.0–46.0)
Lymphs Abs: 3.1 10*3/uL (ref 0.7–4.0)
MCHC: 34.8 g/dL (ref 30.0–36.0)
MCV: 87.8 fl (ref 78.0–100.0)
Monocytes Absolute: 0.9 10*3/uL (ref 0.1–1.0)
Monocytes Relative: 7.1 % (ref 3.0–12.0)
Neutro Abs: 8.4 10*3/uL — ABNORMAL HIGH (ref 1.4–7.7)
Neutrophils Relative %: 64.9 % (ref 43.0–77.0)
Platelets: 230 10*3/uL (ref 150.0–400.0)
RBC: 5.64 Mil/uL (ref 4.22–5.81)
RDW: 13.9 % (ref 11.5–15.5)
WBC: 12.9 10*3/uL — ABNORMAL HIGH (ref 4.0–10.5)

## 2022-03-28 LAB — LIPID PANEL
Cholesterol: 163 mg/dL (ref 0–200)
HDL: 31.7 mg/dL — ABNORMAL LOW (ref >39.00)
LDL Cholesterol: 107 mg/dL — ABNORMAL HIGH (ref 0–99)
NonHDL: 131.43
Total CHOL/HDL Ratio: 5
Triglycerides: 124 mg/dL (ref 0.0–149.0)
VLDL: 24.8 mg/dL (ref 0.0–40.0)

## 2022-03-28 LAB — HEPATIC FUNCTION PANEL
ALT: 39 U/L (ref 0–53)
AST: 26 U/L (ref 0–37)
Albumin: 5 g/dL (ref 3.5–5.2)
Alkaline Phosphatase: 82 U/L (ref 39–117)
Bilirubin, Direct: 0.1 mg/dL (ref 0.0–0.3)
Total Bilirubin: 0.6 mg/dL (ref 0.2–1.2)
Total Protein: 8.2 g/dL (ref 6.0–8.3)

## 2022-03-28 LAB — BASIC METABOLIC PANEL
BUN: 9 mg/dL (ref 6–23)
CO2: 27 mEq/L (ref 19–32)
Calcium: 9.9 mg/dL (ref 8.4–10.5)
Chloride: 101 mEq/L (ref 96–112)
Creatinine, Ser: 0.97 mg/dL (ref 0.40–1.50)
GFR: 86.51 mL/min (ref >60.00)
Glucose, Bld: 99 mg/dL (ref 70–99)
Potassium: 3.9 mEq/L (ref 3.5–5.1)
Sodium: 137 mEq/L (ref 135–145)

## 2022-03-28 LAB — HEMOGLOBIN A1C: Hgb A1c MFr Bld: 5.5 % (ref 4.6–6.5)

## 2022-03-28 LAB — PSA: PSA: 4.11 ng/mL — ABNORMAL HIGH (ref 0.10–4.00)

## 2022-03-28 LAB — TSH: TSH: 1.24 u[IU]/mL (ref 0.35–5.50)

## 2022-03-28 MED ORDER — AMLODIPINE BESYLATE 10 MG PO TABS: 10.0000 mg | ORAL_TABLET | Freq: Every day | ORAL | 3 refills | Status: DC

## 2022-03-28 MED ORDER — METOPROLOL SUCCINATE ER 50 MG PO TB24: 50.0000 mg | ORAL_TABLET | Freq: Every day | ORAL | 3 refills | Status: DC

## 2022-03-28 MED ORDER — LANSOPRAZOLE 15 MG PO CPDR: DELAYED_RELEASE_CAPSULE | ORAL | 3 refills | Status: DC

## 2022-03-28 NOTE — Progress Notes (Signed)
Subjective:    Patient ID: Guy Houston, male    DOB: [birth year], 57 y.o.   MRN: [id number]  HPI Here for a well exam. He feels well but his BP has been creeping up over the past 2 months. He often gets readings of 160' over 80's at home. He has gained 4 lbs in the past year.    Review of Systems  Constitutional: Negative.   HENT: Negative.    Eyes: Negative.   Respiratory: Negative.    Cardiovascular: Negative.   Gastrointestinal: Negative.   Genitourinary: Negative.   Musculoskeletal: Negative.   Skin: Negative.   Neurological: Negative.   Psychiatric/Behavioral: Negative.         Objective:   Physical Exam Constitutional:      General: He is not in acute distress.    Appearance: Normal appearance. He is well-developed. He is not diaphoretic.  HENT:     Head: Normocephalic and atraumatic.     Right Ear: External ear normal.     Left Ear: External ear normal.     Nose: Nose normal.     Mouth/Throat:     Pharynx: No oropharyngeal exudate.  Eyes:     General: No scleral icterus.       Right eye: No discharge.        Left eye: No discharge.     Conjunctiva/sclera: Conjunctivae normal.     Pupils: Pupils are equal, round, and reactive to light.  Neck:     Thyroid: No thyromegaly.     Vascular: No JVD.     Trachea: No tracheal deviation.  Cardiovascular:     Rate and Rhythm: Normal rate and regular rhythm.     Heart sounds: Normal heart sounds. No murmur heard.    No friction rub. No gallop.  Pulmonary:     Effort: Pulmonary effort is normal. No respiratory distress.     Breath sounds: Normal breath sounds. No wheezing or rales.  Chest:     Chest wall: No tenderness.  Abdominal:     General: Bowel sounds are normal. There is no distension.     Palpations: Abdomen is soft. There is no mass.     Tenderness: There is no abdominal tenderness. There is no guarding or rebound.     Comments: Small non-tender easily reducible umbilical hernia   Genitourinary:     Penis: Normal. No tenderness.      Testes: Normal.     Prostate: Normal.     Rectum: Normal. Guaiac result negative.  Musculoskeletal:        General: No tenderness. Normal range of motion.     Cervical back: Neck supple.  Lymphadenopathy:     Cervical: No cervical adenopathy.  Skin:    General: Skin is warm and dry.     Coloration: Skin is not pale.     Findings: No erythema or rash.  Neurological:     Mental Status: He is alert and oriented to person, place, and time.     Cranial Nerves: No cranial nerve deficit.     Motor: No abnormal muscle tone.     Coordination: Coordination normal.     Deep Tendon Reflexes: Reflexes are normal and symmetric. Reflexes normal.  Psychiatric:        Behavior: Behavior normal.        Thought Content: Thought content normal.        Judgment: Judgment normal.           Assessment &  Plan:  Well exam. We discussed diet and exercise. Get fasting labs. We will simply monitor the hernia for now. For the HTN, we will add Metoprolol succinate 50 mg daily. Recheck in 3-4 weeks.  Alysia Penna, MD

## 2022-03-29 NOTE — Addendum Note (Signed)
Addended by: Alysia Penna A on: 03/29/2022 08:54 AM   Modules accepted: Orders

## 2022-04-02 ENCOUNTER — Encounter: Payer: BC Managed Care – PPO | Admitting: Family Medicine

## 2022-04-17 ENCOUNTER — Ambulatory Visit (INDEPENDENT_AMBULATORY_CARE_PROVIDER_SITE_OTHER): Payer: BC Managed Care – PPO | Admitting: Family Medicine

## 2022-04-17 ENCOUNTER — Encounter: Payer: Self-pay | Admitting: Family Medicine

## 2022-04-17 VITALS — BP 152/100 | HR 60 | Temp 98.7°F | Wt 242.0 lb

## 2022-04-17 DIAGNOSIS — I10 Essential (primary) hypertension: Secondary | ICD-10-CM | POA: Diagnosis not present

## 2022-04-17 MED ORDER — AMLODIPINE BESYLATE 10 MG PO TABS: 10.0000 mg | ORAL_TABLET | Freq: Every day | ORAL | 3 refills | Status: DC

## 2022-04-17 NOTE — Progress Notes (Signed)
   Subjective:    Patient ID: Guy Houston, male    DOB: 06-Sep-1965, 58 y.o.   MRN: [id number]  HPI He was here 3 weeks ago for a well exam, and his BP had been creeping up a little. We added metoprolol succinate 50 mg daily to his Amlodipine. At first this combination was working well, and he was getting readings of 120/80 at home. However he then had trouble getting the refills from [facility] so he has been out of Amlodipine for the past 2 weeks. He feels fine.    Review of Systems  Constitutional: Negative.   Respiratory: Negative.    Cardiovascular: Negative.        Objective:   Physical Exam Constitutional:      Appearance: Normal appearance.  Cardiovascular:     Rate and Rhythm: Normal rate and regular rhythm.     Pulses: Normal pulses.     Heart sounds: Normal heart sounds.  Pulmonary:     Effort: Pulmonary effort is normal.     Breath sounds: Normal breath sounds.  Neurological:     Mental Status: He is alert.           Assessment & Plan:  HTN. We will send in a 30 day supply to his local pharmacy until we can get things straightened out with [facility].  Alysia Penna, MD

## 2022-06-23 ENCOUNTER — Other Ambulatory Visit: Payer: Self-pay | Admitting: Family Medicine

## 2022-07-13 ENCOUNTER — Other Ambulatory Visit: Payer: Self-pay | Admitting: Family Medicine

## 2022-10-31 DIAGNOSIS — R351 Nocturia: Secondary | ICD-10-CM | POA: Diagnosis not present

## 2022-10-31 DIAGNOSIS — R972 Elevated prostate specific antigen [PSA]: Secondary | ICD-10-CM | POA: Diagnosis not present

## 2022-10-31 DIAGNOSIS — N401 Enlarged prostate with lower urinary tract symptoms: Secondary | ICD-10-CM | POA: Diagnosis not present

## 2022-11-09 ENCOUNTER — Other Ambulatory Visit: Payer: Self-pay | Admitting: Urology

## 2022-11-09 DIAGNOSIS — R972 Elevated prostate specific antigen [PSA]: Secondary | ICD-10-CM

## 2022-11-20 ENCOUNTER — Encounter: Payer: Self-pay | Admitting: Family Medicine

## 2022-11-20 ENCOUNTER — Ambulatory Visit (INDEPENDENT_AMBULATORY_CARE_PROVIDER_SITE_OTHER): Payer: BC Managed Care – PPO | Admitting: Family Medicine

## 2022-11-20 VITALS — BP 140/92 | HR 69 | Temp 98.4°F | Wt 243.0 lb

## 2022-11-20 DIAGNOSIS — I10 Essential (primary) hypertension: Secondary | ICD-10-CM | POA: Diagnosis not present

## 2022-11-20 MED ORDER — METOPROLOL SUCCINATE ER 100 MG PO TB24: 100.0000 mg | ORAL_TABLET | Freq: Every day | ORAL | 3 refills | Status: AC

## 2022-11-20 NOTE — Progress Notes (Signed)
Subjective:    Patient ID: JAAZIEL TELLADO, male    DOB: [birth year], 57 y.o.   MRN: [id number]  HPI Here to follow up on HTN. At home he has been averaging 140/90, but he was recently seen in the Urology office and the BP was 194/109. He admits to feeling very anxious that morning. He denies any headaches or chest pains or SOB.    Review of Systems  Constitutional: Negative.   Respiratory: Negative.    Cardiovascular: Negative.   Neurological: Negative.        Objective:   Physical Exam Constitutional:      Appearance: Normal appearance.  Cardiovascular:     Rate and Rhythm: Normal rate and regular rhythm.     Pulses: Normal pulses.     Heart sounds: Normal heart sounds.  Pulmonary:     Effort: Pulmonary effort is normal.     Breath sounds: Normal breath sounds.  Musculoskeletal:     Right lower leg: No edema.     Left lower leg: No edema.  Neurological:     General: No focal deficit present.     Mental Status: He is alert and oriented to person, place, and time.           Assessment & Plan:  HTN. We will increase the Metoprolol succinate to 100 mg daily. He will report back in 3-4 weeks.  Gershon Crane, MD

## 2022-12-24 ENCOUNTER — Ambulatory Visit
Admission: RE | Admit: 2022-12-24 | Discharge: 2022-12-24 | Disposition: A | Discharge status: Home / Self Care | Payer: BC Managed Care – PPO | Source: Ambulatory Visit | Attending: Urology | Admitting: Urology

## 2022-12-24 DIAGNOSIS — R972 Elevated prostate specific antigen [PSA]: Secondary | ICD-10-CM | POA: Diagnosis not present

## 2022-12-24 MED ORDER — GADOPICLENOL 0.5 MMOL/ML IV SOLN
10.0000 mL | Freq: Once | INTRAVENOUS | Status: AC | PRN
  Administered 2022-12-24: 10 mL via INTRAVENOUS

## 2023-01-02 DIAGNOSIS — R351 Nocturia: Secondary | ICD-10-CM | POA: Diagnosis not present

## 2023-01-02 DIAGNOSIS — N401 Enlarged prostate with lower urinary tract symptoms: Secondary | ICD-10-CM | POA: Diagnosis not present

## 2023-01-02 DIAGNOSIS — R972 Elevated prostate specific antigen [PSA]: Secondary | ICD-10-CM | POA: Diagnosis not present

## 2023-01-09 ENCOUNTER — Other Ambulatory Visit: Payer: Self-pay | Admitting: Family Medicine

## 2023-03-25 ENCOUNTER — Ambulatory Visit (INDEPENDENT_AMBULATORY_CARE_PROVIDER_SITE_OTHER): Payer: BC Managed Care – PPO | Admitting: Family Medicine

## 2023-03-25 VITALS — BP 160/100 | HR 73 | Temp 98.2°F | Wt 237.3 lb

## 2023-03-25 DIAGNOSIS — R059 Cough, unspecified: Secondary | ICD-10-CM

## 2023-03-25 LAB — POCT INFLUENZA A/B
Influenza A, POC: NEGATIVE
Influenza B, POC: NEGATIVE

## 2023-03-25 LAB — POC COVID19 BINAXNOW: SARS Coronavirus 2 Ag: NEGATIVE

## 2023-03-25 MED ORDER — BENZONATATE 100 MG PO CAPS
100.0000 mg | ORAL_CAPSULE | Freq: Three times a day (TID) | ORAL | 0 refills | Status: DC | PRN
Start: 2023-03-25 — End: 2023-10-16

## 2023-03-25 NOTE — Progress Notes (Signed)
Established Patient Office Visit  Subjective   Patient ID: Guy Houston, male    DOB: [birth year]  Age: 58 y.o. MRN: [id number]  Chief Complaint  Patient presents with   Cough   Nasal Congestion    HPI   Guy Houston is seen with onset this past Friday night of some bodyaches, cough, congestion.  He has a mild sore throat.  He and his wife were on a cruise with some other family members.  He states several people on the cruise were sick with respiratory illness.  Couple of their other family members had similar symptoms.  He has had some chills intermittently.  Has not taken temperature to confirm whether any fever.  Past medical history significant for GERD and hypertension.  No chronic lung disease.  Past Medical History:  Diagnosis Date   Alcohol abuse    Allergy    GERD (gastroesophageal reflux disease)    Hypertension    Past Surgical History:  Procedure Laterality Date   COLONOSCOPY  04/11/2015   per Dr. Russella Dar, benign polyps, repeat in 10 yrs    NO PAST SURGERIES      reports that he has quit smoking. He has never used smokeless tobacco. He reports that he does not drink alcohol and does not use drugs. family history includes Alcohol abuse in an other family member; Arthritis in an other family member; Diabetes in an other family member; Diverticulitis in his father and sister; Hypertension in an other family member; Stroke in an other family member. Allergies  Allergen Reactions   Lisinopril Cough    Review of Systems  Constitutional:  Positive for chills.  HENT:  Positive for sore throat.   Respiratory:  Positive for cough.   Gastrointestinal:  Negative for abdominal pain.  Genitourinary:  Negative for dysuria.      Objective:     BP (!) 160/100 (BP Location: Left Arm, Patient Position: Sitting, Cuff Size: Large)   Pulse 73   Temp 98.2 F (36.8 C) (Oral)   Wt 237 lb 4.8 oz (107.6 kg)   SpO2 98%   BMI 33.10 kg/m  BP Readings from Last 3 Encounters:   03/25/23 (!) 160/100  11/20/22 (!) 140/92  04/17/22 (!) 152/100   Wt Readings from Last 3 Encounters:  03/25/23 237 lb 4.8 oz (107.6 kg)  11/20/22 243 lb (110.2 kg)  04/17/22 242 lb (109.8 kg)      Physical Exam Vitals reviewed.  Constitutional:      General: He is not in acute distress.    Appearance: He is not ill-appearing or toxic-appearing.  HENT:     Right Ear: Tympanic membrane normal.     Left Ear: Tympanic membrane normal.     Mouth/Throat:     Mouth: Mucous membranes are moist.     Pharynx: Oropharynx is clear. No oropharyngeal exudate or posterior oropharyngeal erythema.  Cardiovascular:     Rate and Rhythm: Normal rate and regular rhythm.  Pulmonary:     Effort: Pulmonary effort is normal.     Breath sounds: Normal breath sounds. No wheezing or rales.  Musculoskeletal:     Cervical back: Neck supple.  Lymphadenopathy:     Cervical: No cervical adenopathy.  Neurological:     Mental Status: He is alert.      No results found for any visits on 03/25/23.    The 10-year ASCVD risk score (Arnett DK, et al., 2019) is: 19.7%    Assessment & Plan:  Flulike illness.  Just returned from a cruise.  Patient nontoxic in appearance.  Nonfocal exam.  Check influenza and COVID tests-both negative Suspect other viral etiology. -Recommend plenty of fluids and rest. -Tessalon Perles 100 mg every 8 hours as needed for cough -Follow-up promptly for any worsening symptoms  Evelena Peat, MD

## 2023-03-25 NOTE — Patient Instructions (Signed)
Influenza and Covid testing negative.

## 2023-04-17 DIAGNOSIS — N4289 Other specified disorders of prostate: Secondary | ICD-10-CM | POA: Diagnosis not present

## 2023-04-17 DIAGNOSIS — C61 Malignant neoplasm of prostate: Secondary | ICD-10-CM | POA: Diagnosis not present

## 2023-04-17 DIAGNOSIS — R351 Nocturia: Secondary | ICD-10-CM | POA: Diagnosis not present

## 2023-04-26 DIAGNOSIS — N401 Enlarged prostate with lower urinary tract symptoms: Secondary | ICD-10-CM | POA: Diagnosis not present

## 2023-04-26 DIAGNOSIS — N5201 Erectile dysfunction due to arterial insufficiency: Secondary | ICD-10-CM | POA: Diagnosis not present

## 2023-04-26 DIAGNOSIS — C61 Malignant neoplasm of prostate: Secondary | ICD-10-CM | POA: Diagnosis not present

## 2023-04-26 DIAGNOSIS — R351 Nocturia: Secondary | ICD-10-CM | POA: Diagnosis not present

## 2023-05-14 DIAGNOSIS — C61 Malignant neoplasm of prostate: Secondary | ICD-10-CM | POA: Diagnosis not present

## 2023-07-11 DIAGNOSIS — M6281 Muscle weakness (generalized): Secondary | ICD-10-CM | POA: Diagnosis not present

## 2023-07-11 DIAGNOSIS — C61 Malignant neoplasm of prostate: Secondary | ICD-10-CM | POA: Diagnosis not present

## 2023-07-25 DIAGNOSIS — N393 Stress incontinence (female) (male): Secondary | ICD-10-CM | POA: Diagnosis not present

## 2023-07-25 DIAGNOSIS — M62838 Other muscle spasm: Secondary | ICD-10-CM | POA: Diagnosis not present

## 2023-07-25 DIAGNOSIS — M6281 Muscle weakness (generalized): Secondary | ICD-10-CM | POA: Diagnosis not present

## 2023-09-20 ENCOUNTER — Other Ambulatory Visit: Payer: Self-pay | Admitting: Urology

## 2023-09-24 DIAGNOSIS — M62838 Other muscle spasm: Secondary | ICD-10-CM | POA: Diagnosis not present

## 2023-09-24 DIAGNOSIS — N393 Stress incontinence (female) (male): Secondary | ICD-10-CM | POA: Diagnosis not present

## 2023-09-24 DIAGNOSIS — M6281 Muscle weakness (generalized): Secondary | ICD-10-CM | POA: Diagnosis not present

## 2023-10-16 NOTE — Patient Instructions (Signed)
 SURGICAL WAITING ROOM VISITATION  Patients having surgery or a procedure may have no more than 2 support people in the waiting area - these visitors may rotate.    Children under the age of 80 must have an adult with them who is not the patient.  Visitors with respiratory illnesses are discouraged from visiting and should remain at home.  If the patient needs to stay at the hospital during part of their recovery, the visitor guidelines for inpatient rooms apply. Pre-op nurse will coordinate an appropriate time for 1 support person to accompany patient in pre-op.  This support person may not rotate.    Please refer to the [facility] website for the visitor guidelines for Inpatients (after your surgery is over and you are in a regular room).       Your procedure is scheduled on:  11/04/2023    Report to [facility] Main Entrance    Report to admitting at  0515 AM   Call this number if you have problems the morning of surgery [phone]            Clear liquid diet the day before surgery.             Magnesium Citrate 8 ounces at 12 noon day before surgery.             Fleets enema nite before surgery.                               If you have questions, please contact your surgeon's office.      Oral Hygiene is also important to reduce your risk of infection.                                    Remember - BRUSH YOUR TEETH THE MORNING OF SURGERY WITH YOUR REGULAR TOOTHPASTE  DENTURES WILL BE REMOVED PRIOR TO SURGERY PLEASE DO NOT APPLY Poly grip OR ADHESIVES!!!   Do NOT smoke after Midnight   Stop all vitamins and herbal supplements 7 days before surgery.   Take these medicines the morning of surgery with A SIP OF WATER: Allegra if needed, toprol     DO NOT TAKE ANY ORAL DIABETIC MEDICATIONS DAY OF YOUR SURGERY  Bring CPAP mask and tubing day of surgery.                              You may not have any metal on your body including hair pins, jewelry, and body  piercing             Do not wear make-up, lotions, powders, perfumes/cologne, or deodorant  Do not wear nail polish including gel and S&S, artificial/acrylic nails, or any other type of covering on natural nails including finger and toenails. If you have artificial nails, gel coating, etc. that needs to be removed by a nail salon please have this removed prior to surgery or surgery may need to be canceled/ delayed if the surgeon/ anesthesia feels like they are unable to be safely monitored.   Do not shave  48 hours prior to surgery.               Men may shave face and neck.   Do not bring valuables to the hospital. [facility] IS NOT  RESPONSIBLE   FOR VALUABLES.   Contacts, glasses, dentures or bridgework may not be worn into surgery.   Bring small overnight bag day of surgery.   DO NOT BRING YOUR HOME MEDICATIONS TO THE HOSPITAL. PHARMACY WILL DISPENSE MEDICATIONS LISTED ON YOUR MEDICATION LIST TO YOU DURING YOUR ADMISSION IN THE HOSPITAL!    Patients discharged on the day of surgery will not be allowed to drive home.  Someone NEEDS to stay with you for the first 24 hours after anesthesia.   Special Instructions: Bring a copy of your healthcare power of attorney and living will documents the day of surgery if you haven't scanned them before.              Please read over the following fact sheets you were given: IF YOU HAVE QUESTIONS ABOUT YOUR PRE-OP INSTRUCTIONS PLEASE CALL [phone].   If you received a COVID test during your pre-op visit  it is requested that you wear a mask when out in public, stay away from anyone that may not be feeling well and notify your surgeon if you develop symptoms. If you test positive for Covid or have been in contact with anyone that has tested positive in the last 10 days please notify you surgeon.    [facility] - Preparing for Surgery Before surgery, you can play an important role.  Because skin is not sterile, your skin needs to be as  free of germs as possible.  You can reduce the number of germs on your skin by washing with CHG (chlorahexidine gluconate) soap before surgery.  CHG is an antiseptic cleaner which kills germs and bonds with the skin to continue killing germs even after washing. Please DO NOT use if you have an allergy to CHG or antibacterial soaps.  If your skin becomes reddened/irritated stop using the CHG and inform your nurse when you arrive at Short Stay. Do not shave (including legs and underarms) for at least 48 hours prior to the first CHG shower.  You may shave your face/neck. Please follow these instructions carefully:  1.  Shower with CHG Soap the night before surgery and the  morning of Surgery.  2.  If you choose to wash your hair, wash your hair first as usual with your  normal  shampoo.  3.  After you shampoo, rinse your hair and body thoroughly to remove the  shampoo.                           4.  Use CHG as you would any other liquid soap.  You can apply chg directly  to the skin and wash                       Gently with a scrungie or clean washcloth.  5.  Apply the CHG Soap to your body ONLY FROM THE NECK DOWN.   Do not use on face/ open                           Wound or open sores. Avoid contact with eyes, ears mouth and genitals (private parts).                       Wash face,  Genitals (private parts) with your normal soap.             6.  Wash thoroughly,  paying special attention to the area where your surgery  will be performed.  7.  Thoroughly rinse your body with warm water from the neck down.  8.  DO NOT shower/wash with your normal soap after using and rinsing off  the CHG Soap.                9.  Pat yourself dry with a clean towel.            10.  Wear clean pajamas.            11.  Place clean sheets on your bed the night of your first shower and do not  sleep with pets. Day of Surgery : Do not apply any lotions/deodorants the morning of surgery.  Please wear clean clothes to the  hospital/surgery center.  FAILURE TO FOLLOW THESE INSTRUCTIONS MAY RESULT IN THE CANCELLATION OF YOUR SURGERY PATIENT SIGNATURE_________________________________  NURSE SIGNATURE__________________________________  ________________________________________________________________________

## 2023-10-16 NOTE — Progress Notes (Addendum)
 Anesthesia Review:  PCP: Daisey Olmsted LOV 11/20/22  Cardiologist : none   PPM/ ICD: Device Orders: Rep Notified:  Chest x-ray : EKG : 10/21/23  Echo : Stress test: Cardiac Cath :   Activity level: can do a flight of stairs without difficulty  Sleep Study/ CPAP : none  Fasting Blood Sugar :      / Checks Blood Sugar -- times a day:    Blood Thinner/ Instructions /Last Dose: ASA / Instructions/ Last Dose :    10/21/23- Blood pressure initial at preop was 205/99.  In right arm IN left arm upon recheck was 187/99.  PT denies any chest pain , headache, shortness of breath, dizziness or blurred vision.  PT states  My blood pressure always goes up when I go to doctor..  PT took am blood pressure med.  PT checks blood pressure daily at home.  Instructed pt to continue to monitor blood pressure and if stays elevated to contac t PCP.  PT voiced understanding.EKG done.    Guy Houston aware.  No new orders given.   CBC done  on 10/21/23 with white count of 12.6 and hgb of 17.8 routed to DR [facility] on 10/21/23.

## 2023-10-21 ENCOUNTER — Other Ambulatory Visit: Payer: Self-pay

## 2023-10-21 ENCOUNTER — Encounter (HOSPITAL_COMMUNITY): Payer: Self-pay

## 2023-10-21 ENCOUNTER — Encounter (HOSPITAL_COMMUNITY)
Admission: RE | Admit: 2023-10-21 | Discharge: 2023-10-21 | Disposition: A | Discharge status: Home / Self Care | Source: Ambulatory Visit | Attending: Urology | Admitting: Urology

## 2023-10-21 VITALS — BP 187/99 | HR 95 | Temp 98.3°F | Resp 16 | Ht 71.0 in | Wt 228.0 lb

## 2023-10-21 DIAGNOSIS — Z01818 Encounter for other preprocedural examination: Secondary | ICD-10-CM | POA: Diagnosis not present

## 2023-10-21 HISTORY — DX: Malignant (primary) neoplasm, unspecified: C80.1

## 2023-10-21 LAB — BASIC METABOLIC PANEL WITH GFR
Anion gap: 11 (ref 5–15)
BUN: 16 mg/dL (ref 6–20)
CO2: 24 mmol/L (ref 22–32)
Calcium: 10.2 mg/dL (ref 8.9–10.3)
Chloride: 100 mmol/L (ref 98–111)
Creatinine, Ser: 1.01 mg/dL (ref 0.61–1.24)
GFR, Estimated: >60 mL/min (ref >60)
Glucose, Bld: 95 mg/dL (ref 70–99)
Potassium: 3.5 mmol/L (ref 3.5–5.1)
Sodium: 135 mmol/L (ref 135–145)

## 2023-10-21 LAB — CBC
HCT: 53.7 % — ABNORMAL HIGH (ref 39.0–52.0)
Hemoglobin: 17.8 g/dL — ABNORMAL HIGH (ref 13.0–17.0)
MCH: 29.1 pg (ref 26.0–34.0)
MCHC: 33.1 g/dL (ref 30.0–36.0)
MCV: 87.7 fL (ref 80.0–100.0)
Platelets: 231 K/uL (ref 150–400)
RBC: 6.12 MIL/uL — ABNORMAL HIGH (ref 4.22–5.81)
RDW: 13 % (ref 11.5–15.5)
WBC: 12.6 K/uL — ABNORMAL HIGH (ref 4.0–10.5)
nRBC: 0 % (ref 0.0–0.2)

## 2023-10-21 LAB — TYPE AND SCREEN
ABO/RH(D): A POS
Antibody Screen: NEGATIVE

## 2023-10-29 DIAGNOSIS — R351 Nocturia: Secondary | ICD-10-CM | POA: Diagnosis not present

## 2023-10-29 DIAGNOSIS — C61 Malignant neoplasm of prostate: Secondary | ICD-10-CM | POA: Diagnosis not present

## 2023-11-01 NOTE — H&P (Signed)
 Office Visit Report     10/29/2023   --------------------------------------------------------------------------------   Guy Houston  MRN: [id number]  DOB: May 19, 1965, 58 year old Male  SSN:    PRIMARY CARE:  Garnette A. Johnny, MD  PRIMARY CARE FAX:  [phone]  REFERRING:  Garnette LABOR. Johnny, MD  PROVIDER:  Valli Shank, M.D.  TREATING:  Ubaldo Eagles, NP  LOCATION:  [facility] [phone]     --------------------------------------------------------------------------------   CC/HPI: 10/29/2023: Patient with below noted history, here today for preoperative appointment prior to undergoing bilateral nerve sparing robot-assisted laparoscopic radical prostatectomy and bilateral pelvic lymphadenectomy with Dr. Renda on 9/8. He has already been evaluated by pelvic floor PT and has been practicing the exercises at home in anticipation of his postoperative recovery. Patient denies any changes in baseline lower urinary tract symptoms including absence of any dysuria or gross hematuria. He has stable nocturia 1-2 times nightly. No interval treatment for UTI. He denies any changes in past medical history, prescription medications taken on daily basis, no interval surgical or procedural intervention. Patient denies any recent chest pain, shortness of breath, nausea/vomiting or fever/chills.     CC: Prostate Cancer   Physician requesting consult: Dr. Valli Shank  PCP: Dr. Garnette Johnny   Mr. Guy Houston is a 58 year old gentleman who was noted to have an elevated PSA of 4.2. This prompted an MRI of the prostate on 12/24/22 that indicated a 9 mm PI-RADS 3 lesion of the right mid/base peripheral zone. He underwent an MR/US  fusion biopsy on 04/17/23 that confirmed Gleason 3+4=7 adenocarcinoma with 1 out of 2 targeted biopsies and 5 out of 12 systematic biopsies positive for malignancy.   Family history: None.   Imaging studies: MRI (12/24/22 - No EPE, SVI, LAD, or bone lesions.    PMH: He has a history of hypertension and GERD.  PSH: No abdominal surgeries.   TNM stage: cT1c N0 Mx  PSA: 4.2  Gleason score: 3+4=7 (GG 2)  Biopsy (04/17/23): 6/14 cores positive  Left: L lateral apex (50%, 3+3=6), L apex (10%, 3+3=6), L lateral mid (10%, 3+3=6)  Right: R base (30%, 3+4=7), R lateral base (5%, 3+3=6)  ROI: 1/2 cores positive (20%, 3+4=7)  Prostate volume: 60.8 cc   Nomogram  OC disease: 68%  EPE: 31%  SVI: 3%  LNI: 3%  PFS (5 year, 10 year): 86%, 76%   Urinary function: IPSS is 4.  Erectile function: SHIM score is 10. He does have difficulty getting and keeping erections and has noticed this to be more of a problem since starting metoprolol  last year. He has previously tried sildenafil but did have some side effects related to this medication.     ALLERGIES: Lisinopril  - Hypersensitivity, cough    MEDICATIONS: Metoprolol  Succinate ER 50 MG Tablet Extended Release 24 Hour 1 tablet PO Daily  Tadalafil 5 MG Tablet 1 tablet PO Daily  Allergy Relief 180 MG Tablet 1 tablet PO Daily  amLODIPine  Besylate 10 MG Tablet 1 tablet PO Daily  Lansoprazole  15 MG Capsule Delayed Release 1 capsule PO Daily     GU PSH: Prostate Needle Biopsy - 04/17/2023     NON-GU PSH: Surgical Pathology, Gross And Microscopic Examination For Prostate Needle - 04/17/2023     GU PMH: Stress Incontinence - 09/24/2023, - 07/25/2023 Prostate Cancer - 07/11/2023, - 05/14/2023, - 04/26/2023 BPH w/LUTS - 04/26/2023, - 04/17/2023, - 01/02/2023, - 10/31/2022 ED due to arterial insufficiency - 04/26/2023 Nocturia - 04/26/2023, - 04/17/2023, -  01/02/2023, - 10/31/2022 Elevated PSA - 04/17/2023, - 01/02/2023, - 10/31/2022    NON-GU PMH: Muscle weakness (generalized) - 09/24/2023, - 07/25/2023, - 07/11/2023 Other muscle spasm - 09/24/2023, - 07/25/2023 GERD Hypertension    FAMILY HISTORY: No Family History    SOCIAL HISTORY: Marital Status: Married Ethnicity: Not Hispanic Or Latino; Race: Black or African  American Current Smoking Status: Patient does not smoke anymore. Has not smoked since 10/27/1992.   Tobacco Use Assessment Completed: Used Tobacco in last 30 days? Has never drank.  Drinks 2 caffeinated drinks per day.    REVIEW OF SYSTEMS:    GU Review Male:   Patient reports get up at night to urinate. Patient denies frequent urination, hard to postpone urination, burning/ pain with urination, leakage of urine, stream starts and stops, trouble starting your stream, have to strain to urinate , erection problems, and penile pain.  Gastrointestinal (Upper):   Patient denies nausea, vomiting, and indigestion/ heartburn.  Gastrointestinal (Lower):   Patient denies diarrhea and constipation.  Constitutional:   Patient denies weight loss, fever, night sweats, and fatigue.  Skin:   Patient denies skin rash/ lesion and itching.  Eyes:   Patient denies blurred vision and double vision.  Ears/ Nose/ Throat:   Patient denies sore throat and sinus problems.  Hematologic/Lymphatic:   Patient denies swollen glands and easy bruising.  Cardiovascular:   Patient denies leg swelling and chest pains.  Respiratory:   Patient denies cough and shortness of breath.  Endocrine:   Patient denies excessive thirst.  Musculoskeletal:   Patient denies back pain and joint pain.  Neurological:   Patient denies headaches and dizziness.  Psychologic:   Patient denies depression and anxiety.   VITAL SIGNS:      10/29/2023 12:49 PM  BP 170/104 mmHg  Pulse 60 /min   MULTI-SYSTEM PHYSICAL EXAMINATION:    Constitutional: Well-nourished. No physical deformities. Normally developed. Good grooming.  Neck: Neck symmetrical, not swollen. Normal tracheal position.  Respiratory: No labored breathing, no use of accessory muscles.   Cardiovascular: Normal temperature, normal extremity pulses, no swelling, no varicosities.  Skin: No paleness, no jaundice, no cyanosis. No lesion, no ulcer, no rash.  Neurologic / Psychiatric:  Oriented to time, oriented to place, oriented to person. No depression, no anxiety, no agitation.  Gastrointestinal: No mass, no tenderness, no rigidity, non obese abdomen.  Musculoskeletal: Normal gait and station of head and neck.     Complexity of Data:  Source Of History:  Patient, Medical Record Summary  Lab Test Review:   PSA  Records Review:   Pathology Reports, Previous Doctor Records, Previous Hospital Records, Previous Patient Records  Urine Test Review:   Urinalysis, Urine Culture  Urodynamics Review:   Review Bladder Scan  X-Ray Review: MRI Prostate GSORAD: Reviewed Report.     10/31/22  PSA  Total PSA 4.20 ng/mL  Free PSA 0.54 ng/mL  % Free PSA 13 % PSA    10/29/23  Urinalysis  Urine Appearance Clear   Urine Color Yellow   Urine Glucose Neg mg/dL  Urine Bilirubin Neg mg/dL  Urine Ketones Neg mg/dL  Urine Specific Gravity 1.010   Urine Blood Trace ery/uL  Urine pH 6.5   Urine Protein Neg mg/dL  Urine Urobilinogen 0.2 mg/dL  Urine Nitrites Neg   Urine Leukocyte Esterase Neg leu/uL  Urine WBC/hpf NS (Not Seen)   Urine RBC/hpf 3 - 10/hpf   Urine Epithelial Cells NS (Not Seen)   Urine Bacteria NS (Not Seen)  Urine Mucous Not Present   Urine Yeast NS (Not Seen)   Urine Trichomonas Not Present   Urine Cystals NS (Not Seen)   Urine Casts NS (Not Seen)   Urine Sperm Not Present    PROCEDURES:          Urinalysis w/Scope Dipstick Dipstick Cont'd Micro  Color: Yellow Bilirubin: Neg mg/dL WBC/hpf: NS (Not Seen)  Appearance: Clear Ketones: Neg mg/dL RBC/hpf: 3 - 89/yeq  Specific Gravity: 1.010 Blood: Trace ery/uL Bacteria: NS (Not Seen)  pH: 6.5 Protein: Neg mg/dL Cystals: NS (Not Seen)  Glucose: Neg mg/dL Urobilinogen: 0.2 mg/dL Casts: NS (Not Seen)    Nitrites: Neg Trichomonas: Not Present    Leukocyte Esterase: Neg leu/uL Mucous: Not Present      Epithelial Cells: NS (Not Seen)      Yeast: NS (Not Seen)      Sperm: Not Present    ASSESSMENT:       ICD-10 Details  1 GU:   Prostate Cancer - C61 Chronic, Threat to Bodily Function  3   Nocturia - R35.1 Chronic, Stable  2 NON-GU:   Encounter for other preprocedural examination - Z01.818 Undiagnosed New Problem   PLAN:           Orders Labs Urine Culture          Schedule Return Visit/Planned Activity: Keep Scheduled Appointment - Follow up MD, Schedule Surgery          Document Letter(s):  Created for Patient: Clinical Summary         Notes:   All questions answered to the best of my ability regarding the upcoming procedure and expected postoperative course with understanding expressed by the patient and his wife. Urine culture sent today to serve as precautionary baseline. He will proceed with previously scheduled prostatectomy with Dr. Renda on 9/8.        Next Appointment:      Next Appointment: 11/04/2023 07:15 AM    Appointment Type: Surgery     Location: [facility] [phone]    Provider: Gretel Renda, M.D.    Reason for Visit: WL/OP RALP LEV 2 AND BPLND WITH AMANDA      * Signed by Ubaldo Eagles, NP on 10/29/23 at 1:19 PM (EDT)*

## 2023-11-01 NOTE — Anesthesia Preprocedure Evaluation (Addendum)
 Anesthesia Evaluation  Patient identified by MRN, date of birth, ID band Patient awake    Reviewed: Allergy & Precautions, NPO status , Patient's Chart, lab work & pertinent test results  History of Anesthesia Complications Negative for: history of anesthetic complications  Airway Mallampati: III  TM Distance: >3 FB Neck ROM: Full    Dental  (+) Dental Advisory Given   Pulmonary neg shortness of breath, neg sleep apnea, neg COPD, neg recent URI, former smoker   Pulmonary exam normal breath sounds clear to auscultation       Cardiovascular hypertension (metoprpolol), Pt. on home beta blockers (-) angina (-) Past MI, (-) Cardiac Stents and (-) CABG (-) dysrhythmias  Rhythm:Regular Rate:Normal     Neuro/Psych negative neurological ROS     GI/Hepatic ,GERD  ,,(+)     substance abuse (in remission)  alcohol use  Endo/Other  negative endocrine ROS    Renal/GU negative Renal ROS   Prostate cancer    Musculoskeletal   Abdominal  (+) + obese  Peds  Hematology negative hematology ROS (+) Lab Results      Component                Value               Date                      WBC                      12.6 (H)            10/21/2023                HGB                      17.8 (H)            10/21/2023                HCT                      53.7 (H)            10/21/2023                MCV                      87.7                10/21/2023                PLT                      231                 10/21/2023              Anesthesia Other Findings   Reproductive/Obstetrics                              Anesthesia Physical Anesthesia Plan  ASA: 3  Anesthesia Plan: General   Post-op Pain Management: Tylenol  PO (pre-op)*   Induction: Intravenous  PONV Risk Score and Plan: 2 and Ondansetron , Dexamethasone  and Treatment may vary due to age or medical condition  Airway Management Planned:  Oral ETT  Additional Equipment:   Intra-op Plan:   Post-operative Plan:  Extubation in OR  Informed Consent: I have reviewed the patients History and Physical, chart, labs and discussed the procedure including the risks, benefits and alternatives for the proposed anesthesia with the patient or authorized representative who has indicated his/her understanding and acceptance.     Dental advisory given  Plan Discussed with: CRNA and Anesthesiologist  Anesthesia Plan Comments: (Risks of general anesthesia discussed including, but not limited to, sore throat, hoarse voice, chipped/damaged teeth, injury to vocal cords, nausea and vomiting, allergic reactions, lung infection, heart attack, stroke, and death. All questions answered. )         Anesthesia Quick Evaluation

## 2023-11-04 ENCOUNTER — Other Ambulatory Visit: Payer: Self-pay

## 2023-11-04 ENCOUNTER — Encounter (HOSPITAL_COMMUNITY)
Admission: RE | Disposition: A | Discharge status: Home / Self Care | Payer: Self-pay | Source: Home / Self Care | Attending: Urology

## 2023-11-04 ENCOUNTER — Ambulatory Visit (HOSPITAL_COMMUNITY): Payer: Self-pay | Admitting: Medical

## 2023-11-04 ENCOUNTER — Observation Stay (HOSPITAL_COMMUNITY)
Admission: RE | Admit: 2023-11-04 | Discharge: 2023-11-05 | Disposition: A | Discharge status: Home / Self Care | Attending: Urology | Admitting: Urology

## 2023-11-04 ENCOUNTER — Ambulatory Visit (HOSPITAL_COMMUNITY): Payer: Self-pay | Admitting: Certified Registered Nurse Anesthetist

## 2023-11-04 ENCOUNTER — Encounter (HOSPITAL_COMMUNITY): Payer: Self-pay | Admitting: Urology

## 2023-11-04 DIAGNOSIS — Z01818 Encounter for other preprocedural examination: Secondary | ICD-10-CM | POA: Diagnosis not present

## 2023-11-04 DIAGNOSIS — C61 Malignant neoplasm of prostate: Principal | ICD-10-CM | POA: Diagnosis present

## 2023-11-04 DIAGNOSIS — Z87891 Personal history of nicotine dependence: Secondary | ICD-10-CM | POA: Insufficient documentation

## 2023-11-04 DIAGNOSIS — I10 Essential (primary) hypertension: Secondary | ICD-10-CM | POA: Insufficient documentation

## 2023-11-04 DIAGNOSIS — R351 Nocturia: Secondary | ICD-10-CM | POA: Insufficient documentation

## 2023-11-04 HISTORY — PX: ROBOT ASSISTED LAPAROSCOPIC RADICAL PROSTATECTOMY: SHX5141

## 2023-11-04 LAB — HEMOGLOBIN AND HEMATOCRIT, BLOOD
HCT: 47.9 % (ref 39.0–52.0)
Hemoglobin: 16.3 g/dL (ref 13.0–17.0)

## 2023-11-04 LAB — ABO/RH: ABO/RH(D): A POS

## 2023-11-04 SURGERY — PROSTATECTOMY, RADICAL, ROBOT-ASSISTED, LAPAROSCOPIC
Anesthesia: General

## 2023-11-04 MED ORDER — TRAMADOL HCL 50 MG PO TABS: 50.0000 mg | ORAL_TABLET | Freq: Four times a day (QID) | ORAL | 0 refills | Status: AC | PRN

## 2023-11-04 MED ORDER — ONDANSETRON HCL 4 MG/2ML IJ SOLN
INTRAMUSCULAR | Status: DC | PRN
  Administered 2023-11-04: 4 mg via INTRAVENOUS

## 2023-11-04 MED ORDER — TRAMADOL HCL 50 MG PO TABS
ORAL_TABLET | ORAL | Status: AC
  Filled 2023-11-04: qty 1

## 2023-11-04 MED ORDER — TRAMADOL HCL 50 MG PO TABS
50.0000 mg | ORAL_TABLET | Freq: Four times a day (QID) | ORAL | Status: DC | PRN
  Administered 2023-11-04: 50 mg via ORAL

## 2023-11-04 MED ORDER — CEFAZOLIN SODIUM-DEXTROSE 2-4 GM/100ML-% IV SOLN
2.0000 g | INTRAVENOUS | Status: AC
  Administered 2023-11-04: 2 g via INTRAVENOUS
  Filled 2023-11-04: qty 100

## 2023-11-04 MED ORDER — OXYCODONE HCL 5 MG PO TABS: 5.0000 mg | ORAL_TABLET | Freq: Once | ORAL | Status: DC | PRN

## 2023-11-04 MED ORDER — MIDAZOLAM HCL 2 MG/2ML IJ SOLN
INTRAMUSCULAR | Status: AC
  Filled 2023-11-04: qty 2

## 2023-11-04 MED ORDER — PROPOFOL 10 MG/ML IV BOLUS
INTRAVENOUS | Status: DC | PRN
Start: 2023-11-04 — End: 2023-11-04
  Administered 2023-11-04: 200 mg via INTRAVENOUS

## 2023-11-04 MED ORDER — PHENYLEPHRINE HCL-NACL 20-0.9 MG/250ML-% IV SOLN
INTRAVENOUS | Status: DC | PRN
  Administered 2023-11-04: 20 ug/min via INTRAVENOUS

## 2023-11-04 MED ORDER — SUGAMMADEX SODIUM 200 MG/2ML IV SOLN
INTRAVENOUS | Status: AC
  Filled 2023-11-04: qty 2

## 2023-11-04 MED ORDER — DEXAMETHASONE SODIUM PHOSPHATE 10 MG/ML IJ SOLN
INTRAMUSCULAR | Status: DC | PRN
  Administered 2023-11-04: 10 mg via INTRAVENOUS

## 2023-11-04 MED ORDER — KETAMINE HCL 50 MG/5ML IJ SOSY
PREFILLED_SYRINGE | INTRAMUSCULAR | Status: AC
  Filled 2023-11-04: qty 5

## 2023-11-04 MED ORDER — SIMETHICONE 80 MG PO CHEW
80.0000 mg | CHEWABLE_TABLET | Freq: Four times a day (QID) | ORAL | Status: DC | PRN
  Administered 2023-11-04: 80 mg via ORAL
  Filled 2023-11-04: qty 1

## 2023-11-04 MED ORDER — LACTATED RINGERS IV SOLN: INTRAVENOUS | Status: DC

## 2023-11-04 MED ORDER — SUGAMMADEX SODIUM 200 MG/2ML IV SOLN
INTRAVENOUS | Status: DC | PRN
  Administered 2023-11-04: 200 mg via INTRAVENOUS

## 2023-11-04 MED ORDER — EPHEDRINE 5 MG/ML INJ
INTRAVENOUS | Status: AC
  Filled 2023-11-04: qty 5

## 2023-11-04 MED ORDER — AMISULPRIDE (ANTIEMETIC) 5 MG/2ML IV SOLN: 10.0000 mg | Freq: Once | INTRAVENOUS | Status: DC | PRN

## 2023-11-04 MED ORDER — PHENYLEPHRINE HCL-NACL 20-0.9 MG/250ML-% IV SOLN: INTRAVENOUS | Status: DC | PRN

## 2023-11-04 MED ORDER — SENNOSIDES-DOCUSATE SODIUM 8.6-50 MG PO TABS
2.0000 | ORAL_TABLET | Freq: Every day | ORAL | Status: DC
  Administered 2023-11-04: 2 via ORAL
  Filled 2023-11-04: qty 2

## 2023-11-04 MED ORDER — KETOROLAC TROMETHAMINE 15 MG/ML IJ SOLN
15.0000 mg | Freq: Four times a day (QID) | INTRAMUSCULAR | Status: DC
  Administered 2023-11-04 – 2023-11-05 (×4): 15 mg via INTRAVENOUS
  Filled 2023-11-04 (×4): qty 1

## 2023-11-04 MED ORDER — BUPIVACAINE-EPINEPHRINE (PF) 0.25% -1:200000 IJ SOLN
INTRAMUSCULAR | Status: AC
  Filled 2023-11-04: qty 30

## 2023-11-04 MED ORDER — LACTATED RINGERS IV SOLN
INTRAVENOUS | Status: DC | PRN
  Administered 2023-11-04: 1000 mL

## 2023-11-04 MED ORDER — ROCURONIUM BROMIDE 10 MG/ML (PF) SYRINGE
PREFILLED_SYRINGE | INTRAVENOUS | Status: AC
  Filled 2023-11-04: qty 10

## 2023-11-04 MED ORDER — DEXAMETHASONE SODIUM PHOSPHATE 10 MG/ML IJ SOLN
INTRAMUSCULAR | Status: AC
  Filled 2023-11-04: qty 1

## 2023-11-04 MED ORDER — SODIUM CHLORIDE (PF) 0.9 % IJ SOLN
INTRAMUSCULAR | Status: AC
  Filled 2023-11-04: qty 20

## 2023-11-04 MED ORDER — STERILE WATER FOR IRRIGATION IR SOLN
Status: DC | PRN
  Administered 2023-11-04: 1000 mL

## 2023-11-04 MED ORDER — LIDOCAINE 2% (20 MG/ML) 5 ML SYRINGE: INTRAMUSCULAR | Status: DC | PRN

## 2023-11-04 MED ORDER — CHLORHEXIDINE GLUCONATE 0.12 % MT SOLN
15.0000 mL | Freq: Once | OROMUCOSAL | Status: AC
  Administered 2023-11-04: 15 mL via OROMUCOSAL

## 2023-11-04 MED ORDER — SULFAMETHOXAZOLE-TRIMETHOPRIM 800-160 MG PO TABS: 1.0000 | ORAL_TABLET | Freq: Two times a day (BID) | ORAL | 0 refills | Status: AC

## 2023-11-04 MED ORDER — HEPARIN SODIUM (PORCINE) 1000 UNIT/ML IJ SOLN
INTRAMUSCULAR | Status: AC
  Filled 2023-11-04: qty 1

## 2023-11-04 MED ORDER — EPHEDRINE SULFATE-NACL 50-0.9 MG/10ML-% IV SOSY
PREFILLED_SYRINGE | INTRAVENOUS | Status: DC | PRN
  Administered 2023-11-04: 5 mg via INTRAVENOUS

## 2023-11-04 MED ORDER — ONDANSETRON HCL 4 MG/2ML IJ SOLN: 4.0000 mg | INTRAMUSCULAR | Status: DC | PRN

## 2023-11-04 MED ORDER — OXYCODONE HCL 5 MG/5ML PO SOLN: 5.0000 mg | Freq: Once | ORAL | Status: DC | PRN

## 2023-11-04 MED ORDER — ACETAMINOPHEN 10 MG/ML IV SOLN
1000.0000 mg | Freq: Four times a day (QID) | INTRAVENOUS | Status: DC
  Administered 2023-11-04 – 2023-11-05 (×3): 1000 mg via INTRAVENOUS
  Filled 2023-11-04 (×3): qty 100

## 2023-11-04 MED ORDER — ACETAMINOPHEN 500 MG PO TABS
1000.0000 mg | ORAL_TABLET | Freq: Once | ORAL | Status: AC
  Administered 2023-11-04: 1000 mg via ORAL
  Filled 2023-11-04: qty 2

## 2023-11-04 MED ORDER — FENTANYL CITRATE (PF) 250 MCG/5ML IJ SOLN
INTRAMUSCULAR | Status: DC | PRN
  Administered 2023-11-04: 50 ug via INTRAVENOUS
  Administered 2023-11-04: 100 ug via INTRAVENOUS
  Administered 2023-11-04 (×2): 50 ug via INTRAVENOUS

## 2023-11-04 MED ORDER — ORAL CARE MOUTH RINSE: 15.0000 mL | Freq: Once | OROMUCOSAL | Status: AC

## 2023-11-04 MED ORDER — LIDOCAINE HCL (PF) 2 % IJ SOLN
INTRAMUSCULAR | Status: DC | PRN
  Administered 2023-11-04: 100 mg via INTRADERMAL
  Administered 2023-11-04: 1.5 mg/kg/h via INTRADERMAL

## 2023-11-04 MED ORDER — PROPOFOL 10 MG/ML IV BOLUS
INTRAVENOUS | Status: AC
  Filled 2023-11-04: qty 20

## 2023-11-04 MED ORDER — FENTANYL CITRATE (PF) 250 MCG/5ML IJ SOLN
INTRAMUSCULAR | Status: AC
  Filled 2023-11-04: qty 5

## 2023-11-04 MED ORDER — LIDOCAINE HCL 2 % IJ SOLN
INTRAMUSCULAR | Status: AC
  Filled 2023-11-04: qty 20

## 2023-11-04 MED ORDER — DOCUSATE SODIUM 100 MG PO CAPS: 100.0000 mg | ORAL_CAPSULE | Freq: Two times a day (BID) | ORAL | Status: AC

## 2023-11-04 MED ORDER — ONDANSETRON HCL 4 MG/2ML IJ SOLN
INTRAMUSCULAR | Status: AC
  Filled 2023-11-04: qty 2

## 2023-11-04 MED ORDER — MIDAZOLAM HCL 5 MG/5ML IJ SOLN
INTRAMUSCULAR | Status: DC | PRN
  Administered 2023-11-04: 2 mg via INTRAVENOUS

## 2023-11-04 MED ORDER — ROCURONIUM BROMIDE 10 MG/ML (PF) SYRINGE
PREFILLED_SYRINGE | INTRAVENOUS | Status: DC | PRN
  Administered 2023-11-04 (×2): 20 mg via INTRAVENOUS
  Administered 2023-11-04: 60 mg via INTRAVENOUS
  Administered 2023-11-04 (×2): 20 mg via INTRAVENOUS

## 2023-11-04 MED ORDER — METOPROLOL TARTRATE 50 MG PO TABS
50.0000 mg | ORAL_TABLET | Freq: Two times a day (BID) | ORAL | Status: DC
  Administered 2023-11-04: 50 mg via ORAL
  Filled 2023-11-04 (×2): qty 1

## 2023-11-04 MED ORDER — BUPIVACAINE-EPINEPHRINE (PF) 0.25% -1:200000 IJ SOLN
INTRAMUSCULAR | Status: DC | PRN
  Administered 2023-11-04: 30 mL

## 2023-11-04 MED ORDER — DOCUSATE SODIUM 100 MG PO CAPS
100.0000 mg | ORAL_CAPSULE | Freq: Two times a day (BID) | ORAL | Status: DC
  Administered 2023-11-04 – 2023-11-05 (×2): 100 mg via ORAL
  Filled 2023-11-04 (×2): qty 1

## 2023-11-04 MED ORDER — FENTANYL CITRATE PF 50 MCG/ML IJ SOSY: 25.0000 ug | PREFILLED_SYRINGE | INTRAMUSCULAR | Status: DC | PRN

## 2023-11-04 MED ORDER — KETAMINE HCL 10 MG/ML IJ SOLN
INTRAMUSCULAR | Status: DC | PRN
  Administered 2023-11-04: 30 mg via INTRAVENOUS
  Administered 2023-11-04: 20 mg via INTRAVENOUS

## 2023-11-04 MED ORDER — HYDROMORPHONE HCL 1 MG/ML IJ SOLN: 0.5000 mg | INTRAMUSCULAR | Status: DC | PRN

## 2023-11-04 MED ORDER — SODIUM CHLORIDE 0.9 % IV BOLUS
1000.0000 mL | Freq: Once | INTRAVENOUS | Status: AC
  Administered 2023-11-04: 1000 mL via INTRAVENOUS

## 2023-11-04 MED ORDER — POTASSIUM CHLORIDE IN NACL 20-0.9 MEQ/L-% IV SOLN
INTRAVENOUS | Status: DC
  Filled 2023-11-04: qty 1000

## 2023-11-04 MED ORDER — LIDOCAINE HCL (PF) 2 % IJ SOLN
INTRAMUSCULAR | Status: AC
  Filled 2023-11-04: qty 5

## 2023-11-04 SURGICAL SUPPLY — 54 items
APPLICATOR COTTON TIP 6 STRL (MISCELLANEOUS) ×2 IMPLANT
BAG COUNTER SPONGE SURGICOUNT (BAG) IMPLANT
CATH FOLEY 2WAY SLVR 18FR 30CC (CATHETERS) ×2 IMPLANT
CATH ROBINSON RED A/P 16FR (CATHETERS) ×2 IMPLANT
CATH ROBINSON RED A/P 8FR (CATHETERS) ×2 IMPLANT
CATH TIEMANN FOLEY 18FR 5CC (CATHETERS) ×2 IMPLANT
CHLORAPREP W/TINT 26 (MISCELLANEOUS) ×2 IMPLANT
CLIP LIGATING HEM O LOK PURPLE (MISCELLANEOUS) ×2 IMPLANT
COVER SURGICAL LIGHT HANDLE (MISCELLANEOUS) ×2 IMPLANT
COVER TIP SHEARS 8 DVNC (MISCELLANEOUS) ×2 IMPLANT
CUTTER ECHEON FLEX ENDO 45 340 (ENDOMECHANICALS) ×2 IMPLANT
DERMABOND ADVANCED .7 DNX12 (GAUZE/BANDAGES/DRESSINGS) ×2 IMPLANT
DRAPE ARM DVNC X/XI (DISPOSABLE) ×8 IMPLANT
DRAPE COLUMN DVNC XI (DISPOSABLE) ×2 IMPLANT
DRAPE SURG IRRIG POUCH 19X23 (DRAPES) ×2 IMPLANT
DRIVER NDL LRG 8 DVNC XI (INSTRUMENTS) ×4 IMPLANT
DRIVER NDLE LRG 8 DVNC XI (INSTRUMENTS) ×4 IMPLANT
DRSG TEGADERM 4X4.75 (GAUZE/BANDAGES/DRESSINGS) ×2 IMPLANT
ELECT PENCIL ROCKER SW 15FT (MISCELLANEOUS) ×2 IMPLANT
ELECT REM PT RETURN 15FT ADLT (MISCELLANEOUS) ×2 IMPLANT
FORCEPS BPLR LNG DVNC XI (INSTRUMENTS) ×2 IMPLANT
FORCEPS PROGRASP DVNC XI (FORCEP) ×2 IMPLANT
GAUZE SPONGE 2X2 8PLY STRL LF (GAUZE/BANDAGES/DRESSINGS) IMPLANT
GAUZE SPONGE 4X4 12PLY STRL (GAUZE/BANDAGES/DRESSINGS) ×2 IMPLANT
GLOVE BIO SURGEON STRL SZ 6.5 (GLOVE) ×2 IMPLANT
GLOVE SURG LX STRL 7.5 STRW (GLOVE) ×4 IMPLANT
GOWN STRL REUS W/ TWL XL LVL3 (GOWN DISPOSABLE) ×4 IMPLANT
GOWN STRL SURGICAL XL XLNG (GOWN DISPOSABLE) ×2 IMPLANT
HOLDER FOLEY CATH W/STRAP (MISCELLANEOUS) ×2 IMPLANT
IRRIGATION SUCT STRKRFLW 2 WTP (MISCELLANEOUS) ×2 IMPLANT
IV LACTATED RINGERS 1000ML (IV SOLUTION) ×2 IMPLANT
KIT TURNOVER KIT A (KITS) ×2 IMPLANT
NDL SAFETY ECLIPSE 18X1.5 (NEEDLE) IMPLANT
PACK ROBOT UROLOGY CUSTOM (CUSTOM PROCEDURE TRAY) ×2 IMPLANT
PLUG CATH AND CAP STRL 200 (CATHETERS) ×2 IMPLANT
RELOAD STAPLE 45 4.1 GRN THCK (STAPLE) ×2 IMPLANT
SCISSORS LAP 5X45 EPIX DISP (ENDOMECHANICALS) IMPLANT
SCISSORS MNPLR CVD DVNC XI (INSTRUMENTS) ×2 IMPLANT
SEAL UNIV 5-12 XI (MISCELLANEOUS) ×8 IMPLANT
SET TUBE SMOKE EVAC HIGH FLOW (TUBING) ×2 IMPLANT
SOL PREP POV-IOD 4OZ 10% (MISCELLANEOUS) ×2 IMPLANT
SOLUTION ELECTROSURG ANTI STCK (MISCELLANEOUS) ×2 IMPLANT
SPIKE FLUID TRANSFER (MISCELLANEOUS) ×2 IMPLANT
SUT ETHILON 3 0 PS 1 (SUTURE) ×2 IMPLANT
SUT MNCRL 3 0 RB1 (SUTURE) ×2 IMPLANT
SUT MNCRL 3 0 VIOLET RB1 (SUTURE) ×2 IMPLANT
SUT MNCRL AB 4-0 PS2 18 (SUTURE) ×4 IMPLANT
SUT PDS PLUS AB 0 CT-2 (SUTURE) ×4 IMPLANT
SUT VIC AB 0 CT1 27XBRD ANTBC (SUTURE) ×4 IMPLANT
SUT VIC AB 2-0 SH 27X BRD (SUTURE) ×2 IMPLANT
SUT VIC AB 3-0 SH 27X BRD (SUTURE) IMPLANT
SYR 27GX1/2 1ML LL SAFETY (SYRINGE) ×2 IMPLANT
TROCAR Z THREAD OPTICAL 12X100 (TROCAR) IMPLANT
WATER STERILE IRR 1000ML POUR (IV SOLUTION) ×2 IMPLANT

## 2023-11-04 NOTE — Discharge Instructions (Signed)

## 2023-11-04 NOTE — Anesthesia Procedure Notes (Signed)
 Procedure Name: Intubation Date/Time: 11/04/2023 7:20 AM  Performed by: Zulema Leita PARAS, CRNAPre-anesthesia Checklist: Patient identified, Emergency Drugs available, Suction available and Patient being monitored Patient Re-evaluated:Patient Re-evaluated prior to induction Oxygen Delivery Method: Circle system utilized Preoxygenation: Pre-oxygenation with 100% oxygen Induction Type: IV induction Ventilation: Mask ventilation without difficulty Laryngoscope Size: Mac and 4 Grade View: Grade III Tube type: Oral Tube size: 7.5 mm Number of attempts: 1 Airway Equipment and Method: Stylet Placement Confirmation: ETT inserted through vocal cords under direct vision, positive ETCO2 and breath sounds checked- equal and bilateral Secured at: 22 cm Tube secured with: Tape Dental Injury: Teeth and Oropharynx as per pre-operative assessment

## 2023-11-04 NOTE — Transfer of Care (Signed)
 Immediate Anesthesia Transfer of Care Note  Patient: Guy Houston  Procedure(s) Performed: PROSTATECTOMY, RADICAL, ROBOT-ASSISTED, LAPAROSCOPIC LYMPHADENECTOMY, PELVIS, ROBOT-ASSISTED (Bilateral)  Patient Location: PACU  Anesthesia Type:General  Level of Consciousness: awake, drowsy, and patient cooperative  Airway & Oxygen Therapy: Patient Spontanous Breathing and Patient connected to face mask oxygen  Post-op Assessment: Report given to RN and Post -op Vital signs reviewed and stable  Post vital signs: Reviewed and stable  Last Vitals:  Vitals Value Taken Time  BP 135/124 11/04/23 10:58  Temp    Pulse 54 11/04/23 10:59  Resp 13 11/04/23 10:59  SpO2 100 % 11/04/23 10:59  Vitals shown include unfiled device data.  Last Pain:  Vitals:   11/04/23 0601  TempSrc: Oral  PainSc:          Complications: No notable events documented.

## 2023-11-04 NOTE — Anesthesia Postprocedure Evaluation (Signed)
 Anesthesia Post Note  Patient: Guy Houston  Procedure(s) Performed: PROSTATECTOMY, RADICAL, ROBOT-ASSISTED, LAPAROSCOPIC LYMPHADENECTOMY, PELVIS, ROBOT-ASSISTED (Bilateral)     Patient location during evaluation: PACU Anesthesia Type: General Level of consciousness: awake Pain management: pain level controlled Vital Signs Assessment: post-procedure vital signs reviewed and stable Respiratory status: spontaneous breathing, nonlabored ventilation and respiratory function stable Cardiovascular status: blood pressure returned to baseline and stable Postop Assessment: no apparent nausea or vomiting Anesthetic complications: no   No notable events documented.  Last Vitals:  Vitals:   11/04/23 1145 11/04/23 1200  BP: 122/83 122/84  Pulse: (!) 55 (!) 55  Resp: 10 12  Temp:  36.8 C  SpO2: 96% 97%    Last Pain:  Vitals:   11/04/23 1200  TempSrc:   PainSc: 0-No pain                 [name]

## 2023-11-04 NOTE — Interval H&P Note (Signed)
 History and Physical Interval Note:  11/04/2023 7:01 AM  Guy Houston  has presented today for surgery, with the diagnosis of PROSTATE CANCER.  The various methods of treatment have been discussed with the patient and family. After consideration of risks, benefits and other options for treatment, the patient has consented to  Procedure(s) with comments: PROSTATECTOMY, RADICAL, ROBOT-ASSISTED, LAPAROSCOPIC (N/A) - LEVEL 2 LYMPHADENECTOMY, PELVIS, ROBOT-ASSISTED (Bilateral) as a surgical intervention.  The patient's history has been reviewed, patient examined, no change in status, stable for surgery.  I have reviewed the patient's chart and labs.  Questions were answered to the patient's satisfaction.     [employer]

## 2023-11-04 NOTE — Plan of Care (Signed)
   Problem: Education: Goal: Knowledge of the procedure and recovery process will improve Outcome: Progressing   Problem: Bowel/Gastric: Goal: Gastrointestinal status for postoperative course will improve Outcome: Progressing   Problem: Pain Management: Goal: General experience of comfort will improve Outcome: Progressing   Problem: Skin Integrity: Goal: Demonstration of wound healing without infection will improve Outcome: Progressing   Problem: Urinary Elimination: Goal: Ability to avoid or minimize complications of infection will improve Outcome: Progressing Goal: Ability to achieve and maintain urine output will improve Outcome: Progressing Goal: Home care management will improve Outcome: Progressing   Problem: Education: Goal: Knowledge of General Education information will improve Description: Including pain rating scale, medication(s)/side effects and non-pharmacologic comfort measures Outcome: Progressing   Problem: Health Behavior/Discharge Planning: Goal: Ability to manage health-related needs will improve Outcome: Progressing   Problem: Clinical Measurements: Goal: Ability to maintain clinical measurements within normal limits will improve Outcome: Progressing Goal: Will remain free from infection Outcome: Progressing Goal: Diagnostic test results will improve Outcome: Progressing Goal: Respiratory complications will improve Outcome: Progressing Goal: Cardiovascular complication will be avoided Outcome: Progressing   Problem: Activity: Goal: Risk for activity intolerance will decrease Outcome: Progressing   Problem: Nutrition: Goal: Adequate nutrition will be maintained Outcome: Progressing   Problem: Coping: Goal: Level of anxiety will decrease Outcome: Progressing   Problem: Elimination: Goal: Will not experience complications related to bowel motility Outcome: Progressing Goal: Will not experience complications related to urinary  retention Outcome: Progressing   Problem: Pain Managment: Goal: General experience of comfort will improve and/or be controlled Outcome: Progressing   Problem: Safety: Goal: Ability to remain free from injury will improve Outcome: Progressing   Problem: Skin Integrity: Goal: Risk for impaired skin integrity will decrease Outcome: Progressing

## 2023-11-04 NOTE — Op Note (Signed)
 Preoperative diagnosis: Clinically localized adenocarcinoma of the prostate (clinical stage T1c N0 Mx)  Postoperative diagnosis: Clinically localized adenocarcinoma of the prostate (clinical stage T1c N0 Mx)  Procedure:  Robotic assisted laparoscopic radical prostatectomy (bilateral nerve sparing) Bilateral robotic assisted laparoscopic pelvic lymphadenectomy  Surgeon: Guy Houston. M.D.  Assistant: Alan Hammonds, PA-C  An assistant was required for this surgical procedure.  The duties of the assistant included but were not limited to suctioning, passing suture, camera manipulation, retraction. This procedure would not be able to be performed without an Geophysicist/field seismologist.  Resident: Dr. Lyle Civil  Anesthesia: General  Complications: None  EBL: 200 mL  IVF:  1000 mL crystalloid  Specimens: Prostate and seminal vesicles Right pelvic lymph nodes Left pelvic lymph nodes  Disposition of specimens: Pathology  Drains: 20 Fr coude catheter # 19 Blake pelvic drain  Indication: Guy Houston is a 58 y.o. year old patient with clinically localized prostate cancer.  After a thorough review of the management options for treatment of prostate cancer, he elected to proceed with surgical therapy and the above procedure(s).  We have discussed the potential benefits and risks of the procedure, side effects of the proposed treatment, the likelihood of the patient achieving the goals of the procedure, and any potential problems that might occur during the procedure or recuperation. Informed consent has been obtained.  Description of procedure:  The patient was taken to the operating room and a general anesthetic was administered. He was given preoperative antibiotics, placed in the dorsal lithotomy position, and prepped and draped in the usual sterile fashion. Next a preoperative timeout was performed. A urethral catheter was placed into the bladder and a site was selected near the  umbilicus for placement of the camera port. This was placed using a standard open Hassan technique which allowed entry into the peritoneal cavity under direct vision and without difficulty. An 8 mm robotic port was placed and a pneumoperitoneum established. The camera was then used to inspect the abdomen and there was no evidence of any intra-abdominal injuries or other abnormalities. The remaining abdominal ports were then placed. 8 mm robotic ports were placed in the right lower quadrant, left lower quadrant, and far left lateral abdominal wall. A 5 mm port was placed in the right upper quadrant and a 12 mm port was placed in the right lateral abdominal wall for laparoscopic assistance. All ports were placed under direct vision without difficulty. The surgical cart was then docked.   Utilizing the cautery scissors, the bladder was reflected posteriorly allowing entry into the space of Retzius and identification of the endopelvic fascia and prostate. The periprostatic fat was then removed from the prostate allowing full exposure of the endopelvic fascia. The endopelvic fascia was then incised from the apex back to the base of the prostate bilaterally and the underlying levator muscle fibers were swept laterally off the prostate thereby isolating the dorsal venous complex. The dorsal vein was then stapled and divided with a 45 mm Flex Echelon stapler. Attention then turned to the bladder neck which was divided anteriorly thereby allowing entry into the bladder and exposure of the urethral catheter. The catheter balloon was deflated and the catheter was brought into the operative field and used to retract the prostate anteriorly. The posterior bladder neck was then examined and was divided allowing further dissection between the bladder and prostate posteriorly until the vasa deferentia and seminal vessels were identified. The vasa deferentia were isolated, divided, and lifted anteriorly. The seminal  vesicles were  dissected down to their tips with care to control the seminal vascular arterial blood supply. These structures were then lifted anteriorly and the space between Denonvillier's fascia and the anterior rectum was developed with a combination of sharp and blunt dissection. This isolated the vascular pedicles of the prostate.  The lateral prostatic fascia was then sharply incised allowing release of the neurovascular bundles bilaterally. The vascular pedicles of the prostate were then ligated with Weck clips between the prostate and neurovascular bundles and divided with sharp cold scissor dissection resulting in neurovascular bundle preservation. The neurovascular bundles were then separated off the apex of the prostate and urethra bilaterally.  The urethra was then sharply transected allowing the prostate specimen to be disarticulated. The pelvis was copiously irrigated and hemostasis was ensured. There was no evidence for rectal injury.  Attention then turned to the right pelvic sidewall. The fibrofatty tissue between the external iliac vein, confluence of the iliac vessels, hypogastric artery, and Cooper's ligament was dissected free from the pelvic sidewall with care to preserve the obturator nerve. Weck clips were used for lymphostasis and hemostasis. An identical procedure was performed on the contralateral side and the lymphatic packets were removed for permanent pathologic analysis.  Attention then turned to the urethral anastomosis. A 2-0 Vicryl slip knot was placed between Denonvillier's fascia, the posterior bladder neck, and the posterior urethra to reapproximate these structures. A double-armed 3-0 Monocryl suture was then used to perform a 360 running tension-free anastomosis between the bladder neck and urethra. A new urethral catheter was then placed into the bladder and irrigated. There were no blood clots within the bladder and the anastomosis appeared to be watertight. A #19 Blake drain was  then brought through the left lateral 8 mm port site and positioned appropriately within the pelvis. It was secured to the skin with a nylon suture. The surgical cart was then undocked. The right lateral 12 mm port site was closed at the fascial level with a 0 Vicryl suture placed laparoscopically. All remaining ports were then removed under direct vision. The prostate specimen was removed intact within the Endopouch retrieval bag via the periumbilical camera port site. This fascial opening was closed with two running 0 PDS sutures. 0.25% Marcaine  was then injected into all port sites and all incisions were reapproximated at the skin level with 4-0 Monocryl subcuticular sutures and Dermabond. The patient appeared to tolerate the procedure well and without complications. The patient was able to be extubated and transferred to the recovery unit in satisfactory condition.   Guy CANDIE Renda Teddie MD

## 2023-11-05 ENCOUNTER — Encounter (HOSPITAL_COMMUNITY): Payer: Self-pay | Admitting: Urology

## 2023-11-05 DIAGNOSIS — I10 Essential (primary) hypertension: Secondary | ICD-10-CM | POA: Diagnosis not present

## 2023-11-05 DIAGNOSIS — Z87891 Personal history of nicotine dependence: Secondary | ICD-10-CM | POA: Diagnosis not present

## 2023-11-05 DIAGNOSIS — C61 Malignant neoplasm of prostate: Secondary | ICD-10-CM | POA: Diagnosis not present

## 2023-11-05 DIAGNOSIS — R351 Nocturia: Secondary | ICD-10-CM | POA: Diagnosis not present

## 2023-11-05 DIAGNOSIS — Z01818 Encounter for other preprocedural examination: Secondary | ICD-10-CM | POA: Diagnosis not present

## 2023-11-05 LAB — BASIC METABOLIC PANEL WITH GFR
Anion gap: 11 (ref 5–15)
BUN: 11 mg/dL (ref 6–20)
CO2: 23 mmol/L (ref 22–32)
Calcium: 8.9 mg/dL (ref 8.9–10.3)
Chloride: 103 mmol/L (ref 98–111)
Creatinine, Ser: 1.03 mg/dL (ref 0.61–1.24)
GFR, Estimated: >60 mL/min (ref >60)
Glucose, Bld: 93 mg/dL (ref 70–99)
Potassium: 4.3 mmol/L (ref 3.5–5.1)
Sodium: 137 mmol/L (ref 135–145)

## 2023-11-05 LAB — HEMOGLOBIN AND HEMATOCRIT, BLOOD
HCT: 46.5 % (ref 39.0–52.0)
Hemoglobin: 15.2 g/dL (ref 13.0–17.0)

## 2023-11-05 MED ORDER — ORAL CARE MOUTH RINSE: 15.0000 mL | OROMUCOSAL | Status: DC | PRN

## 2023-11-05 MED ORDER — ACETAMINOPHEN 500 MG PO TABS: 1000.0000 mg | ORAL_TABLET | Freq: Four times a day (QID) | ORAL | Status: DC

## 2023-11-05 MED ORDER — ACETAMINOPHEN 325 MG PO TABS: 650.0000 mg | ORAL_TABLET | Freq: Four times a day (QID) | ORAL | Status: DC | PRN

## 2023-11-05 MED ORDER — TRAMADOL HCL 50 MG PO TABS: 50.0000 mg | ORAL_TABLET | Freq: Four times a day (QID) | ORAL | Status: DC | PRN

## 2023-11-05 MED ORDER — BISACODYL 10 MG RE SUPP
10.0000 mg | Freq: Once | RECTAL | Status: AC
  Administered 2023-11-05: 10 mg via RECTAL
  Filled 2023-11-05: qty 1

## 2023-11-05 NOTE — Progress Notes (Signed)
 NURSING DISCHARGE NOTE  Discharge instructions were reviewed with the patient and his spouse by this RN. Both the patient and his spouse demonstrated verbal understanding of the discharge instructions and teachings that were provided. Additional discharge instructions concerning the foley catheter that the patient will be discharged with, were reviewed with them by Rexene Alstrom, RN.   At this time of discharge, the patient is alert and oriented to time, person, place and situation. The patient is in no distress, and has no complaints of distress or discomfort. Two peripheral IV's were removed from the patient and a gauze and tape dressing were placed to each site. The patient will be taken to the main entrance of the hospital via wheelchair, where he will further be transported home by his spouse in a private vehicle.   Guy Houston, BSN, RN 11/05/23 12:54 PM

## 2023-11-05 NOTE — Discharge Summary (Signed)
 Date of admission: 11/04/2023  Date of discharge: 11/05/2023  Admission diagnosis: Prostate Cancer  Discharge diagnosis: Prostate Cancer  History and Physical: For full details, please see admission history and physical. Briefly, Guy Houston is a 58 y.o. gentleman with localized prostate cancer.  After discussing management/treatment options, he elected to proceed with surgical treatment.  Hospital Course: KADAR CHANCE was taken to the operating room on 11/04/2023 and underwent a robotic assisted laparoscopic radical prostatectomy. He tolerated this procedure well and without complications. Postoperatively, he was able to be transferred to a regular hospital room following recovery from anesthesia.  He was able to begin ambulating the night of surgery. He remained hemodynamically stable overnight.  He had excellent urine output with appropriately minimal output from his pelvic drain and his pelvic drain was removed on POD #1.  He was transitioned to oral pain medication, tolerated a clear liquid diet, and had met all discharge criteria and was able to be discharged home later on POD#1.  Laboratory values:  Recent Labs    11/04/23 1106 11/05/23 0452  HGB 16.3 15.2  HCT 47.9 46.5    Disposition: Home  Discharge instruction: He was instructed to be ambulatory but to refrain from heavy lifting, strenuous activity, or driving. He was instructed on urethral catheter care.  Discharge medications:   Allergies as of 11/05/2023       Reactions   Lisinopril  Cough        Medication List     STOP taking these medications    ibuprofen 200 MG tablet Commonly known as: ADVIL       TAKE these medications    acetaminophen  500 MG tablet Commonly known as: TYLENOL  Take 500-1,000 mg by mouth every 6 (six) hours as needed for moderate pain (pain score 4-6).   docusate sodium  100 MG capsule Commonly known as: COLACE Take 1 capsule (100 mg total) by mouth 2 (two) times daily.    fexofenadine 180 MG tablet Commonly known as: ALLEGRA Take 180 mg by mouth daily as needed for allergies or rhinitis.   metoprolol  succinate 100 MG 24 hr tablet Commonly known as: TOPROL -XL Take 1 tablet (100 mg total) by mouth daily. Take with or immediately following a meal.   sulfamethoxazole -trimethoprim  800-160 MG tablet Commonly known as: BACTRIM  DS Take 1 tablet by mouth 2 (two) times daily. Start the day prior to foley removal appointment   tadalafil 5 MG tablet Commonly known as: CIALIS Take 5 mg by mouth daily as needed for erectile dysfunction.   traMADol  50 MG tablet Commonly known as: Ultram  Take 1-2 tablets (50-100 mg total) by mouth every 6 (six) hours as needed for moderate pain (pain score 4-6) or severe pain (pain score 7-10).        Followup: He will followup in 1 week for catheter removal and to discuss his surgical pathology results.

## 2023-11-05 NOTE — Plan of Care (Signed)
  Problem: Education: Goal: Knowledge of the procedure and recovery process will improve Outcome: Progressing   Problem: Bowel/Gastric: Goal: Gastrointestinal status for postoperative course will improve Outcome: Progressing   Problem: Pain Management: Goal: General experience of comfort will improve Outcome: Progressing   Problem: Urinary Elimination: Goal: Ability to achieve and maintain urine output will improve Outcome: Progressing   Problem: Education: Goal: Knowledge of General Education information will improve Description: Including pain rating scale, medication(s)/side effects and non-pharmacologic comfort measures Outcome: Progressing   Problem: Clinical Measurements: Goal: Ability to maintain clinical measurements within normal limits will improve Outcome: Progressing Goal: Will remain free from infection Outcome: Progressing Goal: Diagnostic test results will improve Outcome: Progressing Goal: Cardiovascular complication will be avoided Outcome: Progressing   Problem: Activity: Goal: Risk for activity intolerance will decrease Outcome: Progressing   Problem: Coping: Goal: Level of anxiety will decrease Outcome: Progressing   Problem: Elimination: Goal: Will not experience complications related to urinary retention Outcome: Progressing   Problem: Pain Managment: Goal: General experience of comfort will improve and/or be controlled Outcome: Progressing   Problem: Safety: Goal: Ability to remain free from injury will improve Outcome: Progressing

## 2023-11-05 NOTE — Progress Notes (Signed)
 1 Day Post-Op Subjective: The patient is doing well.  No nausea or vomiting. Pain is adequately controlled with tylenol  and tramadol  x1. Hgb stable. JP with 110ccs output.   Objective: Vital signs in last 24 hours: Temp:  [97.6 F (36.4 C)-98.4 F (36.9 C)] 98.4 F (36.9 C) (09/09 0403) Pulse Rate:  [52-74] 58 (09/09 0403) Resp:  [10-19] 19 (09/09 0403) BP: (115-142)/(70-124) 115/70 (09/09 0403) SpO2:  [95 %-100 %] 95 % (09/09 0403)  Intake/Output from previous day: 09/08 0701 - 09/09 0700 In: 2424.3 [P.O.:450; I.V.:1674.3; IV Piggyback:300] Out: 2610 [Urine:2300; Drains:110; Blood:200] Intake/Output this shift: Total I/O In: 559.4 [I.V.:459.4; IV Piggyback:100] Out: 940 [Urine:900; Drains:40]  Physical Exam:  General: Alert and oriented. CV: RRR Lungs: Clear bilaterally. GI: Soft, Nondistended. Incisions: Clean, dry, and intact Urine: Clear Extremities: Nontender, no erythema, no edema.  Lab Results: Recent Labs    11/04/23 1106 11/05/23 0452  HGB 16.3 15.2  HCT 47.9 46.5      Assessment/Plan: POD# 1 s/p robotic prostatectomy. - ML, continue CLD - Ambulate, Incentive spirometry - Transition to oral pain medication - D/C pelvic drain - Plan for likely discharge later today   LOS: 1 day   [employer] 11/05/2023, 6:23 AM

## 2023-11-05 NOTE — Plan of Care (Signed)
  Problem: Education: Goal: Knowledge of the procedure and recovery process will improve Outcome: Adequate for Discharge   Problem: Bowel/Gastric: Goal: Gastrointestinal status for postoperative course will improve Outcome: Adequate for Discharge   Problem: Pain Management: Goal: General experience of comfort will improve Outcome: Adequate for Discharge   Problem: Skin Integrity: Goal: Demonstration of wound healing without infection will improve Outcome: Adequate for Discharge   Problem: Urinary Elimination: Goal: Ability to avoid or minimize complications of infection will improve Outcome: Adequate for Discharge Goal: Ability to achieve and maintain urine output will improve Outcome: Adequate for Discharge Goal: Home care management will improve Outcome: Adequate for Discharge   Problem: Education: Goal: Knowledge of General Education information will improve Description: Including pain rating scale, medication(s)/side effects and non-pharmacologic comfort measures Outcome: Adequate for Discharge   Problem: Health Behavior/Discharge Planning: Goal: Ability to manage health-related needs will improve Outcome: Adequate for Discharge   Problem: Clinical Measurements: Goal: Ability to maintain clinical measurements within normal limits will improve Outcome: Adequate for Discharge Goal: Will remain free from infection Outcome: Adequate for Discharge Goal: Diagnostic test results will improve Outcome: Adequate for Discharge Goal: Respiratory complications will improve Outcome: Adequate for Discharge Goal: Cardiovascular complication will be avoided Outcome: Adequate for Discharge   Problem: Activity: Goal: Risk for activity intolerance will decrease Outcome: Adequate for Discharge   Problem: Nutrition: Goal: Adequate nutrition will be maintained Outcome: Adequate for Discharge   Problem: Coping: Goal: Level of anxiety will decrease Outcome: Adequate for Discharge    Problem: Elimination: Goal: Will not experience complications related to bowel motility Outcome: Adequate for Discharge Goal: Will not experience complications related to urinary retention Outcome: Adequate for Discharge   Problem: Pain Managment: Goal: General experience of comfort will improve and/or be controlled Outcome: Adequate for Discharge   Problem: Safety: Goal: Ability to remain free from injury will improve Outcome: Adequate for Discharge   Problem: Skin Integrity: Goal: Risk for impaired skin integrity will decrease Outcome: Adequate for Discharge

## 2023-11-05 NOTE — TOC Transition Note (Signed)
 Transition of Care [facility]) - Discharge Note   Patient Details  Name: Guy Houston MRN: [id number] Date of Birth: 07/28/1965  Transition of [facility] CM/SW Contact:  Bascom Service, RN Phone Number: 11/05/2023, 9:32 AM   Clinical Narrative:d/c home [place] CM needs.       Final next level of care: Home/Self Care Barriers to Discharge: [place] Barriers Identified   Patient Goals and CMS Choice            Discharge Placement                       Discharge Plan and Services Additional resources added to the After Visit Summary for                                       Social Drivers of Health (SDOH) Interventions SDOH Screenings   Food Insecurity: [place] Food Insecurity (11/04/2023)  Housing: Low Risk  (11/04/2023)  Transportation Needs: [place] Transportation Needs (11/04/2023)  Utilities: Not At Risk (11/04/2023)  Depression (PHQ2-9): Low Risk  (11/20/2022)  Physical Activity: Unknown (04/16/2022)  Social Connections: Unknown (04/16/2022)  Stress: [place] Stress Concern Present (04/16/2022)  Tobacco Use: Medium Risk (11/04/2023)     Readmission Risk Interventions     [place] data to display

## 2023-11-07 LAB — SURGICAL PATHOLOGY

## 2023-11-11 DIAGNOSIS — C61 Malignant neoplasm of prostate: Secondary | ICD-10-CM | POA: Diagnosis not present

## 2023-11-26 DIAGNOSIS — N393 Stress incontinence (female) (male): Secondary | ICD-10-CM | POA: Diagnosis not present

## 2023-11-27 DIAGNOSIS — H40023 Open angle with borderline findings, high risk, bilateral: Secondary | ICD-10-CM | POA: Diagnosis not present

## 2023-11-29 DIAGNOSIS — M6281 Muscle weakness (generalized): Secondary | ICD-10-CM | POA: Diagnosis not present

## 2023-11-29 DIAGNOSIS — N393 Stress incontinence (female) (male): Secondary | ICD-10-CM | POA: Diagnosis not present

## 2023-11-29 DIAGNOSIS — M62838 Other muscle spasm: Secondary | ICD-10-CM | POA: Diagnosis not present

## 2023-12-13 DIAGNOSIS — M6281 Muscle weakness (generalized): Secondary | ICD-10-CM | POA: Diagnosis not present

## 2023-12-13 DIAGNOSIS — N393 Stress incontinence (female) (male): Secondary | ICD-10-CM | POA: Diagnosis not present

## 2023-12-13 DIAGNOSIS — M62838 Other muscle spasm: Secondary | ICD-10-CM | POA: Diagnosis not present

## 2024-01-07 DIAGNOSIS — N393 Stress incontinence (female) (male): Secondary | ICD-10-CM | POA: Diagnosis not present

## 2024-01-07 DIAGNOSIS — M6281 Muscle weakness (generalized): Secondary | ICD-10-CM | POA: Diagnosis not present

## 2024-01-07 DIAGNOSIS — M62838 Other muscle spasm: Secondary | ICD-10-CM | POA: Diagnosis not present

## 2024-02-06 DIAGNOSIS — C61 Malignant neoplasm of prostate: Secondary | ICD-10-CM | POA: Diagnosis not present

## 2024-02-14 DIAGNOSIS — N5201 Erectile dysfunction due to arterial insufficiency: Secondary | ICD-10-CM | POA: Diagnosis not present

## 2024-02-14 DIAGNOSIS — C61 Malignant neoplasm of prostate: Secondary | ICD-10-CM | POA: Diagnosis not present
